# Patient Record
Sex: Female | Born: 1990 | Race: White | Hispanic: No | Marital: Married | Smoking: Never smoker
Health system: Southern US, Community
[De-identification: ages and names within clinical notes are randomized; demographics above are authoritative.]

## PROBLEM LIST (undated history)

## (undated) ENCOUNTER — Inpatient Hospital Stay (HOSPITAL_COMMUNITY): Payer: Self-pay

## (undated) DIAGNOSIS — F419 Anxiety disorder, unspecified: Secondary | ICD-10-CM

## (undated) DIAGNOSIS — Z789 Other specified health status: Secondary | ICD-10-CM

## (undated) HISTORY — PX: CYST EXCISION: SHX5701

## (undated) HISTORY — DX: Anxiety disorder, unspecified: F41.9

---
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
## 2011-08-29 ENCOUNTER — Ambulatory Visit: Payer: Self-pay | Admitting: Obstetrics and Gynecology

## 2011-12-17 ENCOUNTER — Telehealth: Payer: Self-pay | Admitting: Obstetrics and Gynecology

## 2011-12-17 NOTE — Telephone Encounter (Signed)
Tc to pt regarding msg.  Pt states she is getting married in January and wanted to possibly come in to discuss getting on birth control pills, pt states she does not want to get a vaginal exam to get the pills.  Pt advised typically a vaginal exam is done and pt is 21 yoa, so could possibly have a vaginal exam.  Pt declines the appt, advised she may use condoms and come back into the office after she gets married to be put on birth control pills.  Pt voices agreement.

## 2011-12-27 ENCOUNTER — Encounter: Payer: Self-pay | Admitting: Obstetrics and Gynecology

## 2014-11-09 ENCOUNTER — Ambulatory Visit (INDEPENDENT_AMBULATORY_CARE_PROVIDER_SITE_OTHER): Payer: 59 | Admitting: Physician Assistant

## 2014-11-09 ENCOUNTER — Encounter: Payer: Self-pay | Admitting: Physician Assistant

## 2014-11-09 VITALS — BP 106/64 | HR 83 | Ht 63.0 in | Wt 126.0 lb

## 2014-11-09 DIAGNOSIS — Z331 Pregnant state, incidental: Secondary | ICD-10-CM

## 2014-11-09 DIAGNOSIS — F411 Generalized anxiety disorder: Secondary | ICD-10-CM | POA: Diagnosis not present

## 2014-11-09 DIAGNOSIS — Z3490 Encounter for supervision of normal pregnancy, unspecified, unspecified trimester: Secondary | ICD-10-CM

## 2014-11-09 MED ORDER — PRENATAL VITAMIN PLUS LOW IRON 27-1 MG PO TABS: 1.0000 | ORAL_TABLET | Freq: Every day | ORAL | Status: DC

## 2014-11-09 NOTE — Progress Notes (Signed)
   Subjective:    Patient ID: Ariel Andrews, female    DOB: 09/12/1990, 24 y.o.   MRN: [id number]  HPI   Patient is a 24 year old female who presents to the clinic with her mother. She is here to establish care and to confirm pregnancy. She also has some concerns and questions with pregnancy.  Patient has no past medical history. She has no surgical history.Val Riles. Unaware of any significant family history.  History of partake in. She also does not affect. Her last missed. Was 10/05/14 and she has had 5 positive home pregnancy test. She denies any nausea or vomiting. She had some very light bleeding 4 days ago that has since resolved. She does have occasional cramping.   Review of Systems  All other systems reviewed and are negative.      Objective:   Physical Exam  Constitutional: She is oriented to person, place, and time. She appears well-developed and well-nourished.  HENT:  Head: Normocephalic and atraumatic.  Cardiovascular: Normal rate, regular rhythm and normal heart sounds.   Pulmonary/Chest: Effort normal and breath sounds normal.  Neurological: She is alert and oriented to person, place, and time.  Psychiatric: She has a normal mood and affect. Her behavior is normal.          Assessment & Plan:   Pregnancy- will confirm with serum HCG. due date from last missed. Is 08/11/15. Discussed signs of miscarriage. Will send referral for OB care needs appt in next 2-3 at 8 weeks. Prenatal vitamin given. Pt questions about Iodine. Can ask pharmacist how much is in [facility]. 150mcg daily of Iodine is recommended dose. Gave HO with what to expect in 1st trimester and medications to avoid and that are safe.   Anxiety- discussed with patient about anxiety during pregnancy. Encouraged her to partake in relaxation exercises such as the size, essential goals, walking. Patient is mostly concerned that her anxiety will cause a miscarriage. Reassured her that excessive stress is not as healthy for the  fetus is less likely to cause miscarriage. I do not think she is a candidate for medication but that could change as pregnancy progresses.

## 2014-11-09 NOTE — Patient Instructions (Signed)
First Trimester of Pregnancy The first trimester of pregnancy is from week 1 until the end of week 12 (months 1 through 3). A week after a sperm fertilizes an egg, the egg will implant on the wall of the uterus. This embryo will begin to develop into a baby. Genes from you and your partner are forming the baby. The female genes determine whether the baby is a boy or a girl. At 6-8 weeks, the eyes and face are formed, and the heartbeat can be seen on ultrasound. At the end of 12 weeks, all the baby's organs are formed.  Now that you are pregnant, you will want to do everything you can to have a healthy baby. Two of the most important things are to get good prenatal care and to follow your health care provider's instructions. Prenatal care is all the medical care you receive before the baby's birth. This care will help prevent, find, and treat any problems during the pregnancy and childbirth. BODY CHANGES Your body goes through many changes during pregnancy. The changes vary from woman to woman.   You may gain or lose a couple of pounds at first.  You may feel sick to your stomach (nauseous) and throw up (vomit). If the vomiting is uncontrollable, call your health care provider.  You may tire easily.  You may develop headaches that can be relieved by medicines approved by your health care provider.  You may urinate more often. Painful urination may mean you have a bladder infection.  You may develop heartburn as a result of your pregnancy.  You may develop constipation because certain hormones are causing the muscles that push waste through your intestines to slow down.  You may develop hemorrhoids or swollen, bulging veins (varicose veins).  Your breasts may begin to grow larger and become tender. Your nipples may stick out more, and the tissue that surrounds them (areola) may become darker.  Your gums may bleed and may be sensitive to brushing and flossing.  Dark spots or blotches (chloasma,  mask of pregnancy) may develop on your face. This will likely fade after the baby is born.  Your menstrual periods will stop.  You may have a loss of appetite.  You may develop cravings for certain kinds of food.  You may have changes in your emotions from day to day, such as being excited to be pregnant or being concerned that something may go wrong with the pregnancy and baby.  You may have more vivid and strange dreams.  You may have changes in your hair. These can include thickening of your hair, rapid growth, and changes in texture. Some women also have hair loss during or after pregnancy, or hair that feels dry or thin. Your hair will most likely return to normal after your baby is born. WHAT TO EXPECT AT YOUR PRENATAL VISITS During a routine prenatal visit:  You will be weighed to make sure you and the baby are growing normally.  Your blood pressure will be taken.  Your abdomen will be measured to track your baby's growth.  The fetal heartbeat will be listened to starting around week 10 or 12 of your pregnancy.  Test results from any previous visits will be discussed. Your health care provider may ask you:  How you are feeling.  If you are feeling the baby move.  If you have had any abnormal symptoms, such as leaking fluid, bleeding, severe headaches, or abdominal cramping.  If you are using any tobacco products,   including cigarettes, chewing tobacco, and electronic cigarettes.  If you have any questions. Other tests that may be performed during your first trimester include:  Blood tests to find your blood type and to check for the presence of any previous infections. They will also be used to check for low iron levels (anemia) and Rh antibodies. Later in the pregnancy, blood tests for diabetes will be done along with other tests if problems develop.  Urine tests to check for infections, diabetes, or protein in the urine.  An ultrasound to confirm the proper growth  and development of the baby.  An amniocentesis to check for possible genetic problems.  Fetal screens for spina bifida and Down syndrome.  You may need other tests to make sure you and the baby are doing well.  HIV (human immunodeficiency virus) testing. Routine prenatal testing includes screening for HIV, unless you choose not to have this test. HOME CARE INSTRUCTIONS  Medicines  Follow your health care provider's instructions regarding medicine use. Specific medicines may be either safe or unsafe to take during pregnancy.  Take your prenatal vitamins as directed.  If you develop constipation, try taking a stool softener if your health care provider approves. Diet  Eat regular, well-balanced meals. Choose a variety of foods, such as meat or vegetable-based protein, fish, milk and low-fat dairy products, vegetables, fruits, and whole grain breads and cereals. Your health care provider will help you determine the amount of weight gain that is right for you.  Avoid raw meat and uncooked cheese. These carry germs that can cause birth defects in the baby.  Eating four or five small meals rather than three large meals a day may help relieve nausea and vomiting. If you start to feel nauseous, eating a few soda crackers can be helpful. Drinking liquids between meals instead of during meals also seems to help nausea and vomiting.  If you develop constipation, eat more high-fiber foods, such as fresh vegetables or fruit and whole grains. Drink enough fluids to keep your urine clear or pale yellow. Activity and Exercise  Exercise only as directed by your health care provider. Exercising will help you:  Control your weight.  Stay in shape.  Be prepared for labor and delivery.  Experiencing pain or cramping in the lower abdomen or low back is a good sign that you should stop exercising. Check with your health care provider before continuing normal exercises.  Try to avoid standing for long  periods of time. Move your legs often if you must stand in one place for a long time.  Avoid heavy lifting.  Wear low-heeled shoes, and practice good posture.  You may continue to have sex unless your health care provider directs you otherwise. Relief of Pain or Discomfort  Wear a good support bra for breast tenderness.   Take warm sitz baths to soothe any pain or discomfort caused by hemorrhoids. Use hemorrhoid cream if your health care provider approves.   Rest with your legs elevated if you have leg cramps or low back pain.  If you develop varicose veins in your legs, wear support hose. Elevate your feet for 15 minutes, 3-4 times a day. Limit salt in your diet. Prenatal Care  Schedule your prenatal visits by the twelfth week of pregnancy. They are usually scheduled monthly at first, then more often in the last 2 months before delivery.  Write down your questions. Take them to your prenatal visits.  Keep all your prenatal visits as directed by your   health care provider. Safety  Wear your seat belt at all times when driving.  Make a list of emergency phone numbers, including numbers for family, friends, the hospital, and police and fire departments. General Tips  Ask your health care provider for a referral to a local prenatal education class. Begin classes no later than at the beginning of month 6 of your pregnancy.  Ask for help if you have counseling or nutritional needs during pregnancy. Your health care provider can offer advice or refer you to specialists for help with various needs.  Do not use hot tubs, steam rooms, or saunas.  Do not douche or use tampons or scented sanitary pads.  Do not cross your legs for long periods of time.  Avoid cat litter boxes and soil used by cats. These carry germs that can cause birth defects in the baby and possibly loss of the fetus by miscarriage or stillbirth.  Avoid all smoking, herbs, alcohol, and medicines not prescribed by  your health care provider. Chemicals in these affect the formation and growth of the baby.  Do not use any tobacco products, including cigarettes, chewing tobacco, and electronic cigarettes. If you need help quitting, ask your health care provider. You may receive counseling support and other resources to help you quit.  Schedule a dentist appointment. At home, brush your teeth with a soft toothbrush and be gentle when you floss. SEEK MEDICAL CARE IF:   You have dizziness.  You have mild pelvic cramps, pelvic pressure, or nagging pain in the abdominal area.  You have persistent nausea, vomiting, or diarrhea.  You have a bad smelling vaginal discharge.  You have pain with urination.  You notice increased swelling in your face, hands, legs, or ankles. SEEK IMMEDIATE MEDICAL CARE IF:   You have a fever.  You are leaking fluid from your vagina.  You have spotting or bleeding from your vagina.  You have severe abdominal cramping or pain.  You have rapid weight gain or loss.  You vomit blood or material that looks like coffee grounds.  You are exposed to German measles and have never had them.  You are exposed to fifth disease or chickenpox.  You develop a severe headache.  You have shortness of breath.  You have any kind of trauma, such as from a fall or a car accident.   This information is not intended to replace advice given to you by your health care provider. Make sure you discuss any questions you have with your health care provider.   Document Released: 12/25/2000 Document Revised: 01/21/2014 Document Reviewed: 11/10/2012 Elsevier Interactive Patient Education 2016 [employer]  

## 2014-12-13 ENCOUNTER — Encounter: Payer: Self-pay | Admitting: Obstetrics & Gynecology

## 2014-12-13 ENCOUNTER — Ambulatory Visit (INDEPENDENT_AMBULATORY_CARE_PROVIDER_SITE_OTHER): Payer: 59 | Admitting: Obstetrics & Gynecology

## 2014-12-13 VITALS — BP 121/79 | HR 86 | Wt 128.0 lb

## 2014-12-13 DIAGNOSIS — Z3481 Encounter for supervision of other normal pregnancy, first trimester: Secondary | ICD-10-CM

## 2014-12-13 DIAGNOSIS — Z3490 Encounter for supervision of normal pregnancy, unspecified, unspecified trimester: Secondary | ICD-10-CM | POA: Insufficient documentation

## 2014-12-13 DIAGNOSIS — Z3491 Encounter for supervision of normal pregnancy, unspecified, first trimester: Secondary | ICD-10-CM

## 2014-12-13 MED ORDER — CETIRIZINE-PSEUDOEPHEDRINE ER 5-120 MG PO TB12: 1.0000 | ORAL_TABLET | Freq: Two times a day (BID) | ORAL | Status: DC

## 2014-12-14 ENCOUNTER — Encounter: Payer: Self-pay | Admitting: Obstetrics & Gynecology

## 2014-12-14 LAB — OBSTETRIC PANEL
ANTIBODY SCREEN: NEGATIVE
BASOS ABS: 0 10*3/uL (ref 0.0–0.1)
BASOS PCT: 0 % (ref 0–1)
EOS PCT: 3 % (ref 0–5)
Eosinophils Absolute: 0.4 10*3/uL (ref 0.0–0.7)
HEMATOCRIT: 39.6 % (ref 36.0–46.0)
HEMOGLOBIN: 13.7 g/dL (ref 12.0–15.0)
Hepatitis B Surface Ag: NEGATIVE
LYMPHS PCT: 17 % (ref 12–46)
Lymphs Abs: 2.5 10*3/uL (ref 0.7–4.0)
MCH: 29.8 pg (ref 26.0–34.0)
MCHC: 34.6 g/dL (ref 30.0–36.0)
MCV: 86.1 fL (ref 78.0–100.0)
MPV: 11.3 fL (ref 8.6–12.4)
Monocytes Absolute: 0.7 10*3/uL (ref 0.1–1.0)
Monocytes Relative: 5 % (ref 3–12)
Neutro Abs: 10.9 10*3/uL — ABNORMAL HIGH (ref 1.7–7.7)
Neutrophils Relative %: 75 % (ref 43–77)
PLATELETS: 236 10*3/uL (ref 150–400)
RBC: 4.6 MIL/uL (ref 3.87–5.11)
RDW: 13.8 % (ref 11.5–15.5)
RH TYPE: POSITIVE
Rubella: 2.95 Index — ABNORMAL HIGH (ref <0.90)
WBC: 14.5 10*3/uL — AB (ref 4.0–10.5)

## 2014-12-14 LAB — GC/CHLAMYDIA PROBE AMP
CT PROBE, AMP APTIMA: NEGATIVE
GC PROBE AMP APTIMA: NEGATIVE

## 2014-12-14 LAB — HIV ANTIBODY (ROUTINE TESTING W REFLEX): HIV 1&2 Ab, 4th Generation: NONREACTIVE

## 2014-12-14 NOTE — Progress Notes (Signed)
   Subjective:    Ariel ManifoldJessica Spenser is a G1P0 2573w3d being seen today for her first obstetrical visit.  Her obstetrical history is significant for primpara, uncomfortable with pelvic examinations due to unpleasant experience as a  teen.. Patient does intend to breast feed. Pregnancy history fully reviewed.  Patient reports nausea, vomit x1, nasal congestion c/w seasonal allergies.  Filed Vitals:   12/13/14 1514  BP: 121/79  Pulse: 86  Weight: 128 lb (58.06 kg)    HISTORY: OB History  Gravida Para Term Preterm AB SAB TAB Ectopic Multiple Living  1             # Outcome Date GA Lbr Len/2nd Weight Sex Delivery Anes PTL Lv  1 Current              History reviewed. No pertinent past medical history. Past Surgical History  Procedure Laterality Date  . Cyst excision     History reviewed. No pertinent family history.   Exam    Uterus:     Pelvic Exam:    Perineum: No Hemorrhoids   Vulva: normal   Vagina:  Unable to tolerate exam   pH: n/a   Cervix: Unable to tolerate exam   Adnexa: Unable to tolerate exam   Bony Pelvis: Unable to tolerate exam  System: Breast:  normal appearance, no masses or tenderness   Skin: normal coloration and turgor, no rashes    Neurologic: oriented, normal mood   Extremities: normal strength, tone, and muscle mass   HEENT sclera clear, anicteric   Mouth/Teeth mucous membranes moist, pharynx normal without lesions and dental hygiene good   Neck supple and no masses   Cardiovascular: regular rate and rhythm   Respiratory:  appears well, vitals normal, no respiratory distress, acyanotic, normal RR, chest clear, no wheezing, crepitations, rhonchi, normal symmetric air entry   Abdomen: soft, non-tender; bowel sounds normal; no masses,  no organomegaly   Urinary: Unable to tolerate exam      Assessment:    Pregnancy: G1P0 Patient Active Problem List   Diagnosis Date Noted  . Supervision of normal pregnancy, antepartum 12/13/2014  . Anxiety state  11/09/2014        Plan:     Initial labs drawn. Prenatal vitamins. Problem list reviewed and updated. Genetic Screening discussed First Screen: discussed.  She is unsure.  Will discuss again at 12 week vivist or she may call in and requet the test..  Ultrasound discussed; fetal survey: requested.  Follow up in 3 weeks.  Patient unable to tolerate any pelvic exam  We will try again at 12 weeks. Will discuss her history of unpleasant pelvic exam in the past.  Husband was present during this visit.  LEGGETT,KELLY H. 12/14/2014

## 2014-12-15 LAB — CYSTIC FIBROSIS DIAGNOSTIC STUDY

## 2014-12-16 LAB — CULTURE, URINE COMPREHENSIVE

## 2014-12-19 NOTE — Progress Notes (Signed)
Bedside abdominal US shows single IUP 6045w2d CRL 2.65cm FHR 162

## 2015-01-03 ENCOUNTER — Telehealth: Payer: Self-pay | Admitting: *Deleted

## 2015-01-03 ENCOUNTER — Other Ambulatory Visit: Payer: 59 | Admitting: Obstetrics & Gynecology

## 2015-01-03 NOTE — Telephone Encounter (Signed)
Pt called stating that she was afraid that she has been exposed to lead today.  She was sanding a table but was wearing a mask and working outside but found out that the table had lead paint on it.  Spoke with Dr Macon LargeAnyanwu who stated that she felt that it was highly unlikely that she would test positive for the Lead.  Pt is really wanting to come in and have peace of mind.

## 2015-01-05 ENCOUNTER — Ambulatory Visit (HOSPITAL_COMMUNITY): Payer: 59 | Attending: Obstetrics & Gynecology

## 2015-01-05 ENCOUNTER — Other Ambulatory Visit (HOSPITAL_COMMUNITY): Payer: 59

## 2015-01-05 LAB — LEAD, BLOOD (ADULT >= 16 YRS)

## 2015-01-06 ENCOUNTER — Telehealth: Payer: Self-pay | Admitting: *Deleted

## 2015-01-06 ENCOUNTER — Ambulatory Visit (INDEPENDENT_AMBULATORY_CARE_PROVIDER_SITE_OTHER): Payer: 59 | Admitting: Family

## 2015-01-06 VITALS — BP 106/70 | HR 86 | Wt 130.0 lb

## 2015-01-06 DIAGNOSIS — Z3481 Encounter for supervision of other normal pregnancy, first trimester: Secondary | ICD-10-CM

## 2015-01-06 DIAGNOSIS — Z36 Encounter for antenatal screening of mother: Secondary | ICD-10-CM

## 2015-01-06 DIAGNOSIS — Z3401 Encounter for supervision of normal first pregnancy, first trimester: Secondary | ICD-10-CM

## 2015-01-06 NOTE — Progress Notes (Signed)
Patient complaining of still having nausea and vomiting, states she will try motion sickness bands before any medication. Armandina StammerJennifer Malisha Mabey RN BSN

## 2015-01-06 NOTE — Progress Notes (Signed)
Subjective:  Ariel ManifoldJessica Andrews is a 24 y.o. G1P0 at [redacted]w[redacted]d being seen today for ongoing prenatal care.  She is currently monitored for the following issues for this low-risk pregnancy and has anxiety state and supervision of normal pregnancy, antepartum on her problem list.  Patient reports nausea.  Overall nausea has improved over the past week.  Increased this morning.   Contractions: Not present. Vag. Bleeding: None.  Movement: Absent. Denies leaking of fluid.   The following portions of the patient's history were reviewed and updated as appropriate: allergies, current medications, past family history, past medical history, past social history, past surgical history and problem list. Problem list updated.  Objective:   Filed Vitals:   01/06/15 1044  BP: 106/70  Pulse: 86  Weight: 130 lb (58.968 kg)    Fetal Status: Fetal Heart Rate (bpm): 150 Fundal Height: 13 cm Movement: Absent     General:  Alert, oriented and cooperative. Patient is in no acute distress.  Skin: Skin is warm and dry. No rash noted.   Cardiovascular: Normal heart rate noted  Respiratory: Normal respiratory effort, no problems with respiration noted  Abdomen: Soft, gravid, appropriate for gestational age. Pain/Pressure: Absent     Pelvic: Vag. Bleeding: None Vag D/C Character: Thin   Cervical exam deferred        Extremities: Normal range of motion.  Edema: None  Mental Status: Normal mood and affect. Normal behavior. Normal judgment and thought content.   Urinalysis: Urine Protein: Negative Urine Glucose: Negative  Assessment and Plan:  Pregnancy: G1P0 at 291w5d  1. Prenatal care, first pregnancy in first trimester - CULTURE, URINE COMPREHENSIVE - Reviewed new ob lab results - Discussed Baby Scripts program   General obstetric precautions including but not limited to vaginal bleeding and pelvic pain reviewed in detail with the patient. Return in about 8 weeks (around 03/03/2015).   Eino FarberWalidah Kennith GainN Karim, CNM

## 2015-01-06 NOTE — Telephone Encounter (Signed)
Called pt to adv blood lead was normal. Pt expressed understanding.

## 2015-01-07 LAB — CULTURE, URINE COMPREHENSIVE
Colony Count: NO GROWTH
Organism ID, Bacteria: NO GROWTH

## 2015-01-15 NOTE — L&D Delivery Note (Signed)
Delivery Note At 3:26 AM a viable female was delivered via Vaginal, Spontaneous Delivery (Presentation: Right Occiput Anterior).  APGAR: 9, 9; weight 8 lb 9.6 oz (3900 g).  Infant dried and placed on pt's abd. Cord clamped and cut by family member. Hospital cord blood sample collected. Placenta status: Intact, Spontaneous.  Cord: 3 vessels   Anesthesia: Epidural  Episiotomy: None Lacerations: R labial; vaginal Suture Repair: 3.0 vicryl Est. Blood Loss (mL): 668  Bimanual exam after repair due to continued trickle; mod amt clot evacuated. Cytotec 800mcg PO given. Methergine series q 4 hr ordered, and CBC for PPD#1.  Mom to postpartum.  Baby to Couplet care / Skin to Skin.   Cam HaiSHAW, Itzae Mccurdy CNM 07/06/2015, 4:02 AM

## 2015-02-09 ENCOUNTER — Telehealth: Payer: Self-pay | Admitting: *Deleted

## 2015-02-09 ENCOUNTER — Ambulatory Visit (INDEPENDENT_AMBULATORY_CARE_PROVIDER_SITE_OTHER): Payer: 59 | Admitting: Obstetrics & Gynecology

## 2015-02-09 VITALS — BP 120/75 | HR 82 | Wt 136.0 lb

## 2015-02-09 DIAGNOSIS — F525 Vaginismus not due to a substance or known physiological condition: Secondary | ICD-10-CM | POA: Insufficient documentation

## 2015-02-09 DIAGNOSIS — Z3402 Encounter for supervision of normal first pregnancy, second trimester: Secondary | ICD-10-CM

## 2015-02-09 DIAGNOSIS — K5900 Constipation, unspecified: Secondary | ICD-10-CM

## 2015-02-09 MED ORDER — DOCUSATE SODIUM 100 MG PO CAPS: 100.0000 mg | ORAL_CAPSULE | Freq: Two times a day (BID) | ORAL | Status: DC | PRN

## 2015-02-09 NOTE — Telephone Encounter (Signed)
Pt called in stating she has had lower ab/pelvic pain that comes and goes. She denies bleeding. Unsure if its contractions or what it could be. Pt states she has a doppler at home and was able to hear heartbeat. Advised pt to stay well hydrated and we could bring her in for eval to be sure all is well. Pt made appt for today.

## 2015-02-09 NOTE — Progress Notes (Signed)
Subjective:  Ariel Andrews is a 25 y.o. G1P0 at [redacted]w[redacted]d being seen today for ongoing prenatal care.  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  Patient reports several episodes of sharp pain.  Pain is associated with gas and constipation.  No vaginal discharge, vaginal pressure, vaginal bleeding..  Contractions: Not present. Vag. Bleeding: None.  Movement: Present. Denies leaking of fluid.   The following portions of the patient's history were reviewed and updated as appropriate: allergies, current medications, past family history, past medical history, past social history, past surgical history and problem list. Problem list updated.  Objective:   Filed Vitals:   02/09/15 1551  BP: 120/75  Pulse: 82  Weight: 136 lb (61.689 kg)    Fetal Status: Fetal Heart Rate (bpm): 150   Movement: Present     General:  Alert, oriented and cooperative. Patient is in no acute distress.  Skin: Skin is warm and dry. No rash noted.   Cardiovascular: Normal heart rate noted  Respiratory: Normal respiratory effort, no problems with respiration noted  Abdomen: Soft, gravid, appropriate for gestational age. Pain/Pressure: Present     Pelvic: Vag. Bleeding: None Vag D/C Character: Thin   Cervical exam performed      Pt unable to tolerate finger in the vagina further than 1-2 cm.  Extremities: Normal range of motion.  Edema: None  Mental Status: Normal mood and affect. Normal behavior. Normal judgment and thought content.   Urinalysis: Urine Protein: Negative Urine Glucose: Negative  Assessment and Plan:  Pregnancy: G1P0 at [redacted]w[redacted]d  1. Psychogenic vaginismus -vaginal dilators  2.  Constipation -increase water -colace  3.  Anatomy US next week -transabdominal view of cervix  Preterm labor symptoms and general obstetric precautions including but not  limited to vaginal bleeding, contractions, leaking of fluid and fetal movement were reviewed in detail with the patient. Please refer to After Visit Summary for other counseling recommendations.  No Follow-up on file.   Lesly Dukes, MD

## 2015-02-09 NOTE — Progress Notes (Signed)
Pt spoke with CMA today and c/o abd pain.  She was unsure of whether it was round ligament pain vs contractions.  Pt stated that she just wanted to have it checked out.  She is babyscripts and is compliant

## 2015-02-13 ENCOUNTER — Other Ambulatory Visit: Payer: Self-pay | Admitting: Family

## 2015-02-13 ENCOUNTER — Ambulatory Visit (HOSPITAL_COMMUNITY)
Admission: RE | Admit: 2015-02-13 | Discharge: 2015-02-13 | Disposition: A | Discharge status: Home / Self Care | Payer: 59 | Source: Ambulatory Visit | Attending: Family | Admitting: Family

## 2015-02-13 DIAGNOSIS — Z3481 Encounter for supervision of other normal pregnancy, first trimester: Secondary | ICD-10-CM

## 2015-02-13 DIAGNOSIS — Z3A18 18 weeks gestation of pregnancy: Secondary | ICD-10-CM | POA: Insufficient documentation

## 2015-02-13 DIAGNOSIS — O358XX0 Maternal care for other (suspected) fetal abnormality and damage, not applicable or unspecified: Secondary | ICD-10-CM | POA: Insufficient documentation

## 2015-02-13 DIAGNOSIS — Z36 Encounter for antenatal screening of mother: Secondary | ICD-10-CM | POA: Diagnosis not present

## 2015-02-13 DIAGNOSIS — O283 Abnormal ultrasonic finding on antenatal screening of mother: Secondary | ICD-10-CM

## 2015-02-15 ENCOUNTER — Other Ambulatory Visit: Payer: Self-pay | Admitting: Family

## 2015-02-15 ENCOUNTER — Telehealth: Payer: Self-pay

## 2015-02-15 ENCOUNTER — Telehealth: Payer: Self-pay | Admitting: Family

## 2015-02-15 DIAGNOSIS — O283 Abnormal ultrasonic finding on antenatal screening of mother: Secondary | ICD-10-CM | POA: Insufficient documentation

## 2015-02-15 NOTE — Telephone Encounter (Addendum)
Left message for patient to call office to get schedule for genetic counseling.

## 2015-02-15 NOTE — Telephone Encounter (Signed)
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  Pt will coordinate the appointment with staff at [facility] office.

## 2015-02-16 ENCOUNTER — Ambulatory Visit (HOSPITAL_COMMUNITY)
Admission: RE | Admit: 2015-02-16 | Discharge: 2015-02-16 | Disposition: A | Discharge status: Home / Self Care | Payer: 59 | Source: Ambulatory Visit | Attending: Obstetrics & Gynecology | Admitting: Obstetrics & Gynecology

## 2015-02-16 DIAGNOSIS — O283 Abnormal ultrasonic finding on antenatal screening of mother: Secondary | ICD-10-CM

## 2015-02-16 DIAGNOSIS — IMO0002 Reserved for concepts with insufficient information to code with codable children: Secondary | ICD-10-CM | POA: Insufficient documentation

## 2015-02-16 DIAGNOSIS — IMO0001 Reserved for inherently not codable concepts without codable children: Secondary | ICD-10-CM

## 2015-02-16 NOTE — Progress Notes (Signed)
Genetic Counseling  High-Risk Gestation Note  Appointment Date:  02/16/2015 Referred By: Lesly Dukes, MD Date of Birth:  1990/10/10 Partner:  Lynetta Mare   Pregnancy History: G1P0 Estimated Date of Delivery: 07/16/15 Estimated Gestational Age: [redacted]w[redacted]d Attending: Alpha Gula, MD   Mrs. Jonessa Triplett and her husband, Mr. Aerica Rincon, were seen for genetic counseling because of soft markers on previous ultrasound.  In Summary:   Choroid plexus cysts and echogenic intracardiac focus identified on anatomy ultrasound on 02/13/15  Given patient's age alone, considered low risk for fetal aneuploidy  Reviewed CPC as soft marker for Trisomy 74; As an isolated finding, would not significantly alter patient's age-related risk for T18  Reviewed EIF as soft marker for Down syndrome;  If adjusted patient's age related risk, would still be less than risk for complications for amniocentesis  Options of Quad screen, NIPS, and amniocentesis discussed; patient declined all at this time  They may further consider NIPS and plan to first call their insurance to check on coverage  Follow-up ultrasound scheduled for 03/16/15  Mrs. Marvis Bakken was seen at the 02/14/2015 for ultrasound through [facility] Radiology Department.  Ultrasound revealed an echogenic intracardiac focus (EIF) and choroid plexus cysts.  The ultrasound report is under separate cover.  Fetal anatomic survey was not able to be completed on 02/14/15; thus, a follow-up ultrasound is scheduled for 03/16/15 to complete fetal anatomic survey.   We discussed that the second trimester genetic sonogram is targeted at identifying features associated with aneuploidy.  It has evolved as a screening tool used to provide an individualized risk assessment for Down syndrome and other trisomies.  The ability of sonography to aid in the detection of aneuploidies relies on identification of both major structural anomalies and "soft markers."  The  patient was counseled that the latter term refers to findings that are often normal variants and do not cause any significant medical problems.  Nonetheless, these markers have a known association with aneuploidy.    The patient was counseled that an EIF is characterized by calcified papillary muscle leading to a discreet dot in the left, or less commonly, the right ventricle.  We discussed that this finding is typically considered to be a benign variant; however, the risk of aneuploidy is increased when this marker is found in patients who have additional risk factors for fetal aneuploidy (AMA, abnormal screening test, or other markers or anomalies by fetal ultrasound). We reviewed that Mrs. Margarine Grosshans risk to have a fetus with Down syndrome is ~1 in 1147, based on her age and current gestation.  We discussed that the pregnancy would still be considered at low risk for fetal Down syndrome given her age related risk and the ultrasound finding of amniocentesis. If the finding of EIF is used to adjust her age related risk, the chance for fetal Down syndrome is still estimated to be below the risk of amniocentesis.   They were counseled that the choroid plexus is an area in the brain where cerebral spinal fluid, the fluid that bathes the brain and spinal cord, is made.  Cysts, or fluid filled sacs, are sometimes found in the choroid plexus of babies both before and after they are born.  We discussed that approximately 1% of pregnancies evaluated by ultrasound will show choroid plexus cyst (CPCs).  Literature suggests that CPCs are an ultrasound finding in approximately 30-50% of fetuses with trisomy 18, but are an isolated finding in less than 10% of fetuses with trisomy  18.  Mrs. Zully Frane was counseled that when a patient has other risk factors for fetal trisomy 27 (abnormal First trimester or quad screening, advanced maternal age, or another ultrasound finding), CPCs are associated with an increased  risk (LR of 9) for trisomy 64.  Newer literature suggests that in the absence of other risk factors, CPCs are likely a normal variation of development or a benign finding.  CPCs are not associated with an increased risk for fetal Down syndrome. Thus, the finding of CPC would not be expected to significantly alter her age-related risk for trisomy 18 of approximately 1 in 45.   We reviewed chromosomes, nondisjunction, and the common features and prognoses of Down syndrome (trisomy 76) and trisomy 62.  We discussed screening options for fetal aneuploidy including Quad screen and noninvasive prenatal screening (NIPS)/cell free DNA testing. We discussed the benefits and limitations of these including the methodologies of the screen, conditions for which each assesses, detection rates and false positive rates of each.   In addition, we discussed the option of diagnostic testing via amniocentesis.  We reviewed the approximate 1 in 300-500 risk for complications, including spontaneous pregnancy loss. After consideration of all the options, they declined amniocentesis, given the associated risk for complications. Mrs. Lafon also declined screening options today, including Quad screen and NIPS. She plans to further consider NIPS and may proceed with this at a later date.   Both family histories were reviewed and found to be contributory for possibly cystic fibrosis in the patient's paternal distant cousin. She did not report the exact degree of relation and was unsure if this was the exact diagnosis for the relative. Mrs. Antionette Luster was provided with written information regarding cystic fibrosis (CF) including the carrier frequency and incidence in the Caucasian population, the availability of carrier testing and prenatal diagnosis if indicated.  In addition, we discussed that CF is routinely screened for as part of the [place] newborn screening panel. Her risk to be a CF carrier may possibly be increased by this family  history and would depend upon the degree of relation.  She declined CF testing today. Without further information regarding the provided family history, an accurate genetic risk cannot be calculated. Further genetic counseling is warranted if more information is obtained.  Mrs. Eriyonna Matsushita denied exposure to environmental toxins or chemical agents. She denied the use of alcohol, tobacco or street drugs. She denied significant viral illnesses during the course of her pregnancy. Her medical and surgical histories were noncontributory.   I counseled this couple regarding the above risks and available options.  The approximate face-to-face time with the genetic counselor was 45 minutes.     Quinn Plowman, MS Certified Genetic Counselor 02/16/2015

## 2015-02-23 ENCOUNTER — Encounter (HOSPITAL_COMMUNITY): Payer: 59

## 2015-03-03 ENCOUNTER — Encounter: Payer: 59 | Admitting: Advanced Practice Midwife

## 2015-03-10 ENCOUNTER — Encounter (HOSPITAL_COMMUNITY): Payer: Self-pay | Admitting: *Deleted

## 2015-03-10 ENCOUNTER — Inpatient Hospital Stay (HOSPITAL_COMMUNITY)
Admission: AD | Admit: 2015-03-10 | Discharge: 2015-03-10 | Disposition: A | Discharge status: Home / Self Care | Payer: 59 | Source: Ambulatory Visit | Attending: Family Medicine | Admitting: Family Medicine

## 2015-03-10 DIAGNOSIS — O9989 Other specified diseases and conditions complicating pregnancy, childbirth and the puerperium: Secondary | ICD-10-CM | POA: Diagnosis not present

## 2015-03-10 DIAGNOSIS — O26892 Other specified pregnancy related conditions, second trimester: Secondary | ICD-10-CM | POA: Insufficient documentation

## 2015-03-10 DIAGNOSIS — H9313 Tinnitus, bilateral: Secondary | ICD-10-CM | POA: Insufficient documentation

## 2015-03-10 DIAGNOSIS — Z3A25 25 weeks gestation of pregnancy: Secondary | ICD-10-CM | POA: Diagnosis not present

## 2015-03-10 DIAGNOSIS — R42 Dizziness and giddiness: Secondary | ICD-10-CM | POA: Diagnosis present

## 2015-03-10 DIAGNOSIS — E162 Hypoglycemia, unspecified: Secondary | ICD-10-CM

## 2015-03-10 LAB — GLUCOSE, CAPILLARY: GLUCOSE-CAPILLARY: 109 mg/dL — AB (ref 65–99)

## 2015-03-10 LAB — URINE MICROSCOPIC-ADD ON

## 2015-03-10 LAB — URINALYSIS, ROUTINE W REFLEX MICROSCOPIC
BILIRUBIN URINE: NEGATIVE
Glucose, UA: NEGATIVE mg/dL
Ketones, ur: NEGATIVE mg/dL
Leukocytes, UA: NEGATIVE
Nitrite: NEGATIVE
PH: 6 (ref 5.0–8.0)
Protein, ur: NEGATIVE mg/dL

## 2015-03-10 NOTE — Discharge Instructions (Signed)

## 2015-03-10 NOTE — MAU Note (Signed)
PT  SAYS SHE   ATE   BKFT-    THEN PIZZA  AT 12 NOON ,   THEN   CANDY BAR AT  3PM,    THEN WENT  TO EAT  AT 8PM-  FELT   SHAKY , DIZZY, EARS RINGING,  VISION BLACK-      THEN SHE   BEGAN TO FEEL BETTER-  SO  SHE   DRANK  TEA    AND ATE  AND  THEN  SHE  FELT  EVEN  BETTER.      [facility]   [facility]   ON    [facility].         NEXT APPOINTMENT - MARCH  6   HAS AN APPOINTMENT  HERE ON Thursday  FOR  U/S-  AT MFM     ROUTINE-

## 2015-03-10 NOTE — MAU Provider Note (Signed)
  History     CSN: [id number]  Arrival date and time: 03/10/15 2143   None     No chief complaint on file.  HPI Ariel Andrews is a 25yo G1 presenting for eval of feeling shaky and dizzy w/ ringing in ears just prior to eating dinner. Before that she had eaten a candy bar at 3pm. After drinking sweet tea and eating dinner she felt better, but was concerned re blood pressure. She receives [facility] at the [place] office and the pregnancy has been essentially unremarkable.  OB History    Gravida Para Term Preterm AB TAB SAB Ectopic Multiple Living   1               No past medical history on file.  Past Surgical History  Procedure Laterality Date  . Cyst excision      No family history on file.  Social History  Substance Use Topics  . Smoking status: Never Smoker   . Smokeless tobacco: Not on file  . Alcohol Use: No    Allergies: No Known Allergies  Prescriptions prior to admission  Medication Sig Dispense Refill Last Dose  . docusate sodium (COLACE) 100 MG capsule Take 1 capsule (100 mg total) by mouth 2 (two) times daily as needed. 30 capsule 2   . Prenatal Vit-Fe Fumarate-FA (PRENATAL VITAMIN PLUS LOW IRON) 27-1 MG TABS Take 1 tablet by mouth daily. 30 tablet 11 Taking    ROS Physical Exam   Last menstrual period 10/05/2014.  Physical Exam  Constitutional: She is oriented to person, place, and time. She appears well-developed.  HENT:  Head: Normocephalic.  Neck: Normal range of motion.  GI:  FHTs 146bpm  Musculoskeletal: Normal range of motion.  Neurological: She is alert and oriented to person, place, and time.  Skin: Skin is warm and dry.  Psychiatric: She has a normal mood and affect. Her behavior is normal. Thought content normal.   Urinalysis    Component Value Date/Time   COLORURINE YELLOW 03/10/2015 2145   APPEARANCEUR CLEAR 03/10/2015 2145   LABSPEC <1.005* 03/10/2015 2145   PHURINE 6.0 03/10/2015 2145   GLUCOSEU NEGATIVE 03/10/2015 2145   HGBUR  TRACE* 03/10/2015 2145   BILIRUBINUR NEGATIVE 03/10/2015 2145   KETONESUR NEGATIVE 03/10/2015 2145   PROTEINUR NEGATIVE 03/10/2015 2145   NITRITE NEGATIVE 03/10/2015 2145   LEUKOCYTESUR NEGATIVE 03/10/2015 2145   Micro: 0-5 SE, rare bacteria  CBG: 109   MAU Course  Procedures  MDM UA & CBG ordered   Assessment and Plan  IUP@21 .5wks Probable hypoglycemic episode  D/C home Instructed to increase total protein 60-70gm/day and eat it throughout the day at meals and snacks F/U at next scheduled visit  Cam Hai CNM 03/10/2015, 10:04 PM

## 2015-03-16 ENCOUNTER — Encounter: Payer: 59 | Admitting: Obstetrics & Gynecology

## 2015-03-16 ENCOUNTER — Ambulatory Visit (HOSPITAL_COMMUNITY)
Admission: RE | Admit: 2015-03-16 | Discharge: 2015-03-16 | Disposition: A | Discharge status: Home / Self Care | Payer: 59 | Source: Ambulatory Visit | Attending: Obstetrics & Gynecology | Admitting: Obstetrics & Gynecology

## 2015-03-16 ENCOUNTER — Other Ambulatory Visit (HOSPITAL_COMMUNITY): Payer: Self-pay | Admitting: Maternal and Fetal Medicine

## 2015-03-16 ENCOUNTER — Ambulatory Visit (HOSPITAL_COMMUNITY): Payer: 59

## 2015-03-16 VITALS — BP 126/70 | HR 88 | Wt 147.6 lb

## 2015-03-16 DIAGNOSIS — O359XX0 Maternal care for (suspected) fetal abnormality and damage, unspecified, not applicable or unspecified: Secondary | ICD-10-CM

## 2015-03-16 DIAGNOSIS — Z36 Encounter for antenatal screening of mother: Secondary | ICD-10-CM | POA: Insufficient documentation

## 2015-03-16 DIAGNOSIS — Z3A22 22 weeks gestation of pregnancy: Secondary | ICD-10-CM | POA: Diagnosis not present

## 2015-03-16 DIAGNOSIS — Z0489 Encounter for examination and observation for other specified reasons: Secondary | ICD-10-CM

## 2015-03-16 DIAGNOSIS — O283 Abnormal ultrasonic finding on antenatal screening of mother: Secondary | ICD-10-CM

## 2015-03-16 DIAGNOSIS — O358XX0 Maternal care for other (suspected) fetal abnormality and damage, not applicable or unspecified: Secondary | ICD-10-CM | POA: Insufficient documentation

## 2015-03-16 DIAGNOSIS — Z3481 Encounter for supervision of other normal pregnancy, first trimester: Secondary | ICD-10-CM

## 2015-03-16 DIAGNOSIS — IMO0002 Reserved for concepts with insufficient information to code with codable children: Secondary | ICD-10-CM

## 2015-03-16 DIAGNOSIS — IMO0001 Reserved for inherently not codable concepts without codable children: Secondary | ICD-10-CM

## 2015-03-17 ENCOUNTER — Other Ambulatory Visit (HOSPITAL_COMMUNITY): Payer: Self-pay | Admitting: *Deleted

## 2015-03-17 DIAGNOSIS — O35EXX0 Maternal care for other (suspected) fetal abnormality and damage, fetal genitourinary anomalies, not applicable or unspecified: Secondary | ICD-10-CM

## 2015-03-17 DIAGNOSIS — O358XX0 Maternal care for other (suspected) fetal abnormality and damage, not applicable or unspecified: Secondary | ICD-10-CM

## 2015-03-20 ENCOUNTER — Ambulatory Visit (INDEPENDENT_AMBULATORY_CARE_PROVIDER_SITE_OTHER): Payer: 59 | Admitting: Obstetrics & Gynecology

## 2015-03-20 VITALS — BP 119/73 | HR 89 | Wt 147.0 lb

## 2015-03-20 DIAGNOSIS — Z3482 Encounter for supervision of other normal pregnancy, second trimester: Secondary | ICD-10-CM

## 2015-03-20 NOTE — Progress Notes (Signed)
Subjective:  Ariel ManifoldJessica Andrews is a 25 y.o. G1P0 at 6782w1d being seen today for ongoing prenatal care.  She is currently monitored for the following issues for this low-risk pregnancy and has Anxiety state; Supervision of normal pregnancy, antepartum; Psychogenic vaginismus; Abnormal fetal ultrasound; Choroid plexus cyst of fetus; and Echogenic intracardiac focus of fetus on prenatal ultrasound on her problem list.  Patient reports no complaints.  Contractions: Not present. Vag. Bleeding: None.  Movement: Absent. Denies leaking of fluid.   The following portions of the patient's history were reviewed and updated as appropriate: allergies, current medications, past family history, past medical history, past social history, past surgical history and problem list. Problem list updated.  Objective:   Filed Vitals:   03/20/15 1538  BP: 119/73  Pulse: 89  Weight: 147 lb (66.679 kg)    Fetal Status: Fetal Heart Rate (bpm): 141  Fundal Height: 24 cm Movement: Absent     General:  Alert, oriented and cooperative. Patient is in no acute distress.  Skin: Skin is warm and dry. No rash noted.   Cardiovascular: Normal heart rate noted  Respiratory: Normal respiratory effort, no problems with respiration noted  Abdomen: Soft, gravid, appropriate for gestational age. Pain/Pressure: Present     Pelvic: Vag. Bleeding: None Vag D/C Character: Thin   Cervical exam deferred        Extremities: Normal range of motion.  Edema: None  Mental Status: Normal mood and affect. Normal behavior. Normal judgment and thought content.   Urinalysis: Urine Protein: Negative Urine Glucose: Negative  Assessment and Plan:  Pregnancy: G1P0 at 3782w1d  1. Supervision of normal pregnancy, antepartum, second trimester -She declines a flu vaccine  Preterm labor symptoms and general obstetric precautions including but not limited to vaginal bleeding, contractions, leaking of fluid and fetal movement were reviewed in detail with  the patient. Please refer to After Visit Summary for other counseling recommendations.  Return in about 4 weeks (around 04/17/2015) for glucola.   Allie BossierMyra C Jolette Lana, MD

## 2015-04-17 ENCOUNTER — Encounter: Payer: 59 | Admitting: Obstetrics & Gynecology

## 2015-04-24 ENCOUNTER — Encounter: Payer: 59 | Admitting: Obstetrics & Gynecology

## 2015-04-24 DIAGNOSIS — IMO0002 Reserved for concepts with insufficient information to code with codable children: Secondary | ICD-10-CM | POA: Insufficient documentation

## 2015-04-25 NOTE — Progress Notes (Signed)
This encounter was created in error - please disregard.

## 2015-05-05 ENCOUNTER — Ambulatory Visit (INDEPENDENT_AMBULATORY_CARE_PROVIDER_SITE_OTHER): Payer: 59 | Admitting: Family

## 2015-05-05 VITALS — BP 122/68 | HR 94 | Wt 153.0 lb

## 2015-05-05 DIAGNOSIS — O283 Abnormal ultrasonic finding on antenatal screening of mother: Secondary | ICD-10-CM

## 2015-05-05 DIAGNOSIS — Z3403 Encounter for supervision of normal first pregnancy, third trimester: Secondary | ICD-10-CM

## 2015-05-05 DIAGNOSIS — Z36 Encounter for antenatal screening of mother: Secondary | ICD-10-CM | POA: Diagnosis not present

## 2015-05-05 DIAGNOSIS — O289 Unspecified abnormal findings on antenatal screening of mother: Secondary | ICD-10-CM

## 2015-05-05 DIAGNOSIS — Z3483 Encounter for supervision of other normal pregnancy, third trimester: Secondary | ICD-10-CM

## 2015-05-05 DIAGNOSIS — Z3490 Encounter for supervision of normal pregnancy, unspecified, unspecified trimester: Secondary | ICD-10-CM

## 2015-05-05 LAB — CBC
HCT: 35 % (ref 35.0–45.0)
Hemoglobin: 11.8 g/dL (ref 11.7–15.5)
MCH: 31.1 pg (ref 27.0–33.0)
MCHC: 33.7 g/dL (ref 32.0–36.0)
MCV: 92.1 fL (ref 80.0–100.0)
MPV: 11.6 fL (ref 7.5–12.5)
PLATELETS: 124 10*3/uL — AB (ref 140–400)
RBC: 3.8 MIL/uL (ref 3.80–5.10)
RDW: 13.4 % (ref 11.0–15.0)
WBC: 13.1 10*3/uL — ABNORMAL HIGH (ref 3.8–10.8)

## 2015-05-05 NOTE — Progress Notes (Signed)
Subjective:  Ariel Andrews is a 25 y.o. G1P0 at 3670w5d being seen today for ongoing prenatal care.  She is currently monitored for the following issues for this low-risk pregnancy and has Anxiety state; Supervision of normal pregnancy, antepartum; Psychogenic vaginismus; Abnormal fetal ultrasound; Choroid plexus cyst of fetus; Echogenic intracardiac focus of fetus on prenatal ultrasound; and Fetal pyelectasis on her problem list.  Patient reports round ligament pain.  Contractions: Not present. Vag. Bleeding: None.  Movement: Present. Denies leaking of fluid.   The following portions of the patient's history were reviewed and updated as appropriate: allergies, current medications, past family history, past medical history, past social history, past surgical history and problem list. Problem list updated.  Objective:   Filed Vitals:   05/05/15 0842  BP: 122/68  Pulse: 94  Weight: 153 lb (69.4 kg)    Fetal Status: Fetal Heart Rate (bpm): 140 Fundal Height: 30 cm Movement: Present     General:  Alert, oriented and cooperative. Patient is in no acute distress.  Skin: Skin is warm and dry. No rash noted.   Cardiovascular: Normal heart rate noted  Respiratory: Normal respiratory effort, no problems with respiration noted  Abdomen: Soft, gravid, appropriate for gestational age. Pain/Pressure: Present     Pelvic: Vag. Bleeding: None Vag D/C Character: Thin   Cervical exam deferred        Extremities: Normal range of motion.  Edema: None  Mental Status: Normal mood and affect. Normal behavior. Normal judgment and thought content.   Urinalysis: Urine Protein: Negative Urine Glucose: Trace  Assessment and Plan:  Pregnancy: G1P0 at 5770w5d  1. Prenatal care, unspecified trimester - Glucose Tolerance, 1 HR (50g) - RPR - HIV antibody (with reflex) - CBC  2.  Abnormal fetal ultrasound - Follow-up ultrasound scheduled for 05/11/15  Preterm labor symptoms and general obstetric precautions  including but not limited to vaginal bleeding, contractions, leaking of fluid and fetal movement were reviewed in detail with the patient. Please refer to After Visit Summary for other counseling recommendations.  Return for [employer].   Ariel Andrews, CNM

## 2015-05-06 ENCOUNTER — Encounter: Payer: Self-pay | Admitting: Family

## 2015-05-06 DIAGNOSIS — D696 Thrombocytopenia, unspecified: Secondary | ICD-10-CM | POA: Insufficient documentation

## 2015-05-06 DIAGNOSIS — O99119 Other diseases of the blood and blood-forming organs and certain disorders involving the immune mechanism complicating pregnancy, unspecified trimester: Secondary | ICD-10-CM | POA: Insufficient documentation

## 2015-05-06 LAB — RPR

## 2015-05-06 LAB — HIV ANTIBODY (ROUTINE TESTING W REFLEX): HIV: NONREACTIVE

## 2015-05-06 LAB — GLUCOSE TOLERANCE, 1 HOUR (50G) W/O FASTING: GLUCOSE, 1 HR, GESTATIONAL: 135 mg/dL (ref <140)

## 2015-05-08 ENCOUNTER — Telehealth: Payer: Self-pay | Admitting: *Deleted

## 2015-05-08 NOTE — Telephone Encounter (Signed)
Pt notified of abnormal 1 hr GTT and pt is scheduled for a fasting 3 hr GTT.

## 2015-05-11 ENCOUNTER — Other Ambulatory Visit: Payer: 59

## 2015-05-11 ENCOUNTER — Ambulatory Visit (HOSPITAL_COMMUNITY)
Admission: RE | Admit: 2015-05-11 | Discharge: 2015-05-11 | Disposition: A | Discharge status: Home / Self Care | Payer: 59 | Source: Ambulatory Visit | Attending: Family | Admitting: Family

## 2015-05-11 ENCOUNTER — Encounter (HOSPITAL_COMMUNITY): Payer: Self-pay

## 2015-05-11 VITALS — BP 118/62 | HR 86 | Wt 157.5 lb

## 2015-05-11 DIAGNOSIS — O99119 Other diseases of the blood and blood-forming organs and certain disorders involving the immune mechanism complicating pregnancy, unspecified trimester: Secondary | ICD-10-CM

## 2015-05-11 DIAGNOSIS — IMO0001 Reserved for inherently not codable concepts without codable children: Secondary | ICD-10-CM

## 2015-05-11 DIAGNOSIS — Z36 Encounter for antenatal screening of mother: Secondary | ICD-10-CM | POA: Insufficient documentation

## 2015-05-11 DIAGNOSIS — Z3483 Encounter for supervision of other normal pregnancy, third trimester: Secondary | ICD-10-CM

## 2015-05-11 DIAGNOSIS — O358XX0 Maternal care for other (suspected) fetal abnormality and damage, not applicable or unspecified: Secondary | ICD-10-CM | POA: Diagnosis present

## 2015-05-11 DIAGNOSIS — R7309 Other abnormal glucose: Secondary | ICD-10-CM

## 2015-05-11 DIAGNOSIS — Z3A30 30 weeks gestation of pregnancy: Secondary | ICD-10-CM | POA: Diagnosis not present

## 2015-05-11 DIAGNOSIS — O283 Abnormal ultrasonic finding on antenatal screening of mother: Secondary | ICD-10-CM

## 2015-05-11 DIAGNOSIS — O35EXX0 Maternal care for other (suspected) fetal abnormality and damage, fetal genitourinary anomalies, not applicable or unspecified: Secondary | ICD-10-CM

## 2015-05-11 DIAGNOSIS — D696 Thrombocytopenia, unspecified: Secondary | ICD-10-CM

## 2015-05-12 LAB — GLUCOSE TOLERANCE, 3 HOURS
GLUCOSE 3 HOUR GTT: 49 mg/dL — AB (ref <145)
GLUCOSE, 1 HOUR-GESTATIONAL: 112 mg/dL (ref <190)
GLUCOSE, 2 HOUR-GESTATIONAL: 100 mg/dL (ref <165)
GLUCOSE, FASTING-GESTATIONAL: 77 mg/dL (ref 65–104)

## 2015-05-22 ENCOUNTER — Encounter: Payer: 59 | Admitting: Obstetrics & Gynecology

## 2015-06-05 ENCOUNTER — Other Ambulatory Visit: Payer: Self-pay | Admitting: Obstetrics & Gynecology

## 2015-06-20 ENCOUNTER — Inpatient Hospital Stay (HOSPITAL_COMMUNITY)
Admission: AD | Admit: 2015-06-20 | Discharge: 2015-06-20 | Disposition: A | Discharge status: Home / Self Care | Payer: 59 | Source: Ambulatory Visit | Attending: Obstetrics and Gynecology | Admitting: Obstetrics and Gynecology

## 2015-06-20 ENCOUNTER — Encounter (HOSPITAL_COMMUNITY): Payer: Self-pay | Admitting: Advanced Practice Midwife

## 2015-06-20 DIAGNOSIS — Z3483 Encounter for supervision of other normal pregnancy, third trimester: Secondary | ICD-10-CM

## 2015-06-20 DIAGNOSIS — IMO0001 Reserved for inherently not codable concepts without codable children: Secondary | ICD-10-CM

## 2015-06-20 DIAGNOSIS — Z3493 Encounter for supervision of normal pregnancy, unspecified, third trimester: Secondary | ICD-10-CM | POA: Insufficient documentation

## 2015-06-20 DIAGNOSIS — O99119 Other diseases of the blood and blood-forming organs and certain disorders involving the immune mechanism complicating pregnancy, unspecified trimester: Secondary | ICD-10-CM

## 2015-06-20 DIAGNOSIS — O283 Abnormal ultrasonic finding on antenatal screening of mother: Secondary | ICD-10-CM

## 2015-06-20 DIAGNOSIS — D696 Thrombocytopenia, unspecified: Secondary | ICD-10-CM

## 2015-06-20 NOTE — Discharge Instructions (Signed)
Braxton Hicks Contractions °Contractions of the uterus can occur throughout pregnancy. Contractions are not always a sign that you are in labor.  °WHAT ARE BRAXTON HICKS CONTRACTIONS?  °Contractions that occur before labor are called Braxton Hicks contractions, or false labor. Toward the end of pregnancy (32-34 weeks), these contractions can develop more often and may become more forceful. This is not true labor because these contractions do not result in opening (dilatation) and thinning of the cervix. They are sometimes difficult to tell apart from true labor because these contractions can be forceful and people have different pain tolerances. You should not feel embarrassed if you go to the hospital with false labor. Sometimes, the only way to tell if you are in true labor is for your health care provider to look for changes in the cervix. °If there are no prenatal problems or other health problems associated with the pregnancy, it is completely safe to be sent home with false labor and await the onset of true labor. °HOW CAN YOU TELL THE DIFFERENCE BETWEEN TRUE AND FALSE LABOR? °False Labor °· The contractions of false labor are usually shorter and not as hard as those of true labor.   °· The contractions are usually irregular.   °· The contractions are often felt in the front of the lower abdomen and in the groin.   °· The contractions may go away when you walk around or change positions while lying down.   °· The contractions get weaker and are shorter lasting as time goes on.   °· The contractions do not usually become progressively stronger, regular, and closer together as with true labor.   °True Labor °1. Contractions in true labor last 30-70 seconds, become very regular, usually become more intense, and increase in frequency.   °2. The contractions do not go away with walking.   °3. The discomfort is usually felt in the top of the uterus and spreads to the lower abdomen and low back.   °4. True labor can  be determined by your health care provider with an exam. This will show that the cervix is dilating and getting thinner.   °WHAT TO REMEMBER °· Keep up with your usual exercises and follow other instructions given by your health care provider.   °· Take medicines as directed by your health care provider.   °· Keep your regular prenatal appointments.   °· Eat and drink lightly if you think you are going into labor.   °· If Braxton Hicks contractions are making you uncomfortable:   °· Change your position from lying down or resting to walking, or from walking to resting.   °· Sit and rest in a tub of warm water.   °· Drink 2-3 glasses of water. Dehydration may cause these contractions.   °· Do slow and deep breathing several times an hour.   °WHEN SHOULD I SEEK IMMEDIATE MEDICAL CARE? °Seek immediate medical care if: °· Your contractions become stronger, more regular, and closer together.   °· You have fluid leaking or gushing from your vagina.   °· You have a fever.   °· You pass blood-tinged mucus.   °· You have vaginal bleeding.   °· You have continuous abdominal pain.   °· You have low back pain that you never had before.   °· You feel your baby's head pushing down and causing pelvic pressure.   °· Your baby is not moving as much as it used to.   °  °This information is not intended to replace advice given to you by your health care provider. Make sure you discuss any questions you have with your health care   provider. °  °Document Released: 12/31/2004 Document Revised: 01/05/2013 Document Reviewed: 10/12/2012 °Elsevier Interactive Patient Education ©2016 [employer] ° °Fetal Movement Counts °Patient Name: __________________________________________________ Patient Due Date: ____________________ °Performing a fetal movement count is highly recommended in high-risk pregnancies, but it is good for every pregnant woman to do. Your health care provider may ask you to start counting fetal movements at 28 weeks of the  pregnancy. Fetal movements often increase: °· After eating a full meal. °· After physical activity. °· After eating or drinking something sweet or cold. °· At rest. °Pay attention to when you feel the baby is most active. This will help you notice a pattern of your baby's sleep and wake cycles and what factors contribute to an increase in fetal movement. It is important to perform a fetal movement count at the same time each day when your baby is normally most active.  °HOW TO COUNT FETAL MOVEMENTS °5. Find a quiet and comfortable area to sit or lie down on your left side. Lying on your left side provides the best blood and oxygen circulation to your baby. °6. Write down the day and time on a sheet of paper or in a journal. °7. Start counting kicks, flutters, swishes, rolls, or jabs in a 2-hour period. You should feel at least 10 movements within 2 hours. °8. If you do not feel 10 movements in 2 hours, wait 2-3 hours and count again. Look for a change in the pattern or not enough counts in 2 hours. °SEEK MEDICAL CARE IF: °· You feel less than 10 counts in 2 hours, tried twice. °· There is no movement in over an hour. °· The pattern is changing or taking longer each day to reach 10 counts in 2 hours. °· You feel the baby is not moving as he or she usually does. °Date: ____________ Movements: ____________ Start time: ____________ Finish time: ____________  °Date: ____________ Movements: ____________ Start time: ____________ Finish time: ____________ °Date: ____________ Movements: ____________ Start time: ____________ Finish time: ____________ °Date: ____________ Movements: ____________ Start time: ____________ Finish time: ____________ °Date: ____________ Movements: ____________ Start time: ____________ Finish time: ____________ °Date: ____________ Movements: ____________ Start time: ____________ Finish time: ____________ °Date: ____________ Movements: ____________ Start time: ____________ Finish time:  ____________ °Date: ____________ Movements: ____________ Start time: ____________ Finish time: ____________  °Date: ____________ Movements: ____________ Start time: ____________ Finish time: ____________ °Date: ____________ Movements: ____________ Start time: ____________ Finish time: ____________ °Date: ____________ Movements: ____________ Start time: ____________ Finish time: ____________ °Date: ____________ Movements: ____________ Start time: ____________ Finish time: ____________ °Date: ____________ Movements: ____________ Start time: ____________ Finish time: ____________ °Date: ____________ Movements: ____________ Start time: ____________ Finish time: ____________ °Date: ____________ Movements: ____________ Start time: ____________ Finish time: ____________  °Date: ____________ Movements: ____________ Start time: ____________ Finish time: ____________ °Date: ____________ Movements: ____________ Start time: ____________ Finish time: ____________ °Date: ____________ Movements: ____________ Start time: ____________ Finish time: ____________ °Date: ____________ Movements: ____________ Start time: ____________ Finish time: ____________ °Date: ____________ Movements: ____________ Start time: ____________ Finish time: ____________ °Date: ____________ Movements: ____________ Start time: ____________ Finish time: ____________ °Date: ____________ Movements: ____________ Start time: ____________ Finish time: ____________  °Date: ____________ Movements: ____________ Start time: ____________ Finish time: ____________ °Date: ____________ Movements: ____________ Start time: ____________ Finish time: ____________ °Date: ____________ Movements: ____________ Start time: ____________ Finish time: ____________ °Date: ____________ Movements: ____________ Start time: ____________ Finish time: ____________ °Date: ____________ Movements: ____________ Start time: ____________ Finish time: ____________ °Date: ____________ Movements:  ____________ Start time: ____________ Finish   time: ____________ °Date: ____________ Movements: ____________ Start time: ____________ Finish time: ____________  °Date: ____________ Movements: ____________ Start time: ____________ Finish time: ____________ °Date: ____________ Movements: ____________ Start time: ____________ Finish time: ____________ °Date: ____________ Movements: ____________ Start time: ____________ Finish time: ____________ °Date: ____________ Movements: ____________ Start time: ____________ Finish time: ____________ °Date: ____________ Movements: ____________ Start time: ____________ Finish time: ____________ °Date: ____________ Movements: ____________ Start time: ____________ Finish time: ____________ °Date: ____________ Movements: ____________ Start time: ____________ Finish time: ____________  °Date: ____________ Movements: ____________ Start time: ____________ Finish time: ____________ °Date: ____________ Movements: ____________ Start time: ____________ Finish time: ____________ °Date: ____________ Movements: ____________ Start time: ____________ Finish time: ____________ °Date: ____________ Movements: ____________ Start time: ____________ Finish time: ____________ °Date: ____________ Movements: ____________ Start time: ____________ Finish time: ____________ °Date: ____________ Movements: ____________ Start time: ____________ Finish time: ____________ °Date: ____________ Movements: ____________ Start time: ____________ Finish time: ____________  °Date: ____________ Movements: ____________ Start time: ____________ Finish time: ____________ °Date: ____________ Movements: ____________ Start time: ____________ Finish time: ____________ °Date: ____________ Movements: ____________ Start time: ____________ Finish time: ____________ °Date: ____________ Movements: ____________ Start time: ____________ Finish time: ____________ °Date: ____________ Movements: ____________ Start time: ____________ Finish  time: ____________ °Date: ____________ Movements: ____________ Start time: ____________ Finish time: ____________ °Date: ____________ Movements: ____________ Start time: ____________ Finish time: ____________  °Date: ____________ Movements: ____________ Start time: ____________ Finish time: ____________ °Date: ____________ Movements: ____________ Start time: ____________ Finish time: ____________ °Date: ____________ Movements: ____________ Start time: ____________ Finish time: ____________ °Date: ____________ Movements: ____________ Start time: ____________ Finish time: ____________ °Date: ____________ Movements: ____________ Start time: ____________ Finish time: ____________ °Date: ____________ Movements: ____________ Start time: ____________ Finish time: ____________ °  °This information is not intended to replace advice given to you by your health care provider. Make sure you discuss any questions you have with your health care provider. °  °Document Released: 01/30/2006 Document Revised: 01/21/2014 Document Reviewed: 10/28/2011 °Elsevier Interactive Patient Education ©2016 [employer] Inc[employer] ° °

## 2015-06-20 NOTE — MAU Note (Signed)
Pt states she had random diarrhea x 1 yesterday @ 1400, began having lower back pain @ 2330 - became more intense during the night.  Also having abdominal tightening every 3 minutes.  Denies bleeding or LOF.

## 2015-06-30 ENCOUNTER — Inpatient Hospital Stay (HOSPITAL_COMMUNITY)
Admission: AD | Admit: 2015-06-30 | Discharge: 2015-06-30 | Disposition: A | Discharge status: Home / Self Care | Payer: 59 | Source: Ambulatory Visit | Attending: Obstetrics & Gynecology | Admitting: Obstetrics & Gynecology

## 2015-06-30 ENCOUNTER — Ambulatory Visit (INDEPENDENT_AMBULATORY_CARE_PROVIDER_SITE_OTHER): Payer: 59 | Admitting: Family

## 2015-06-30 ENCOUNTER — Encounter (HOSPITAL_COMMUNITY): Payer: Self-pay

## 2015-06-30 VITALS — BP 127/74 | HR 85 | Wt 169.0 lb

## 2015-06-30 DIAGNOSIS — O99119 Other diseases of the blood and blood-forming organs and certain disorders involving the immune mechanism complicating pregnancy, unspecified trimester: Secondary | ICD-10-CM

## 2015-06-30 DIAGNOSIS — Z36 Encounter for antenatal screening of mother: Secondary | ICD-10-CM

## 2015-06-30 DIAGNOSIS — O471 False labor at or after 37 completed weeks of gestation: Secondary | ICD-10-CM | POA: Insufficient documentation

## 2015-06-30 DIAGNOSIS — O283 Abnormal ultrasonic finding on antenatal screening of mother: Secondary | ICD-10-CM

## 2015-06-30 DIAGNOSIS — N898 Other specified noninflammatory disorders of vagina: Secondary | ICD-10-CM | POA: Diagnosis not present

## 2015-06-30 DIAGNOSIS — Z3A37 37 weeks gestation of pregnancy: Secondary | ICD-10-CM | POA: Diagnosis not present

## 2015-06-30 DIAGNOSIS — O479 False labor, unspecified: Secondary | ICD-10-CM

## 2015-06-30 DIAGNOSIS — Z3493 Encounter for supervision of normal pregnancy, unspecified, third trimester: Secondary | ICD-10-CM

## 2015-06-30 DIAGNOSIS — O26893 Other specified pregnancy related conditions, third trimester: Secondary | ICD-10-CM | POA: Diagnosis present

## 2015-06-30 DIAGNOSIS — Z113 Encounter for screening for infections with a predominantly sexual mode of transmission: Secondary | ICD-10-CM | POA: Diagnosis not present

## 2015-06-30 DIAGNOSIS — Z3483 Encounter for supervision of other normal pregnancy, third trimester: Secondary | ICD-10-CM

## 2015-06-30 DIAGNOSIS — IMO0002 Reserved for concepts with insufficient information to code with codable children: Secondary | ICD-10-CM

## 2015-06-30 DIAGNOSIS — Q620 Congenital hydronephrosis: Secondary | ICD-10-CM

## 2015-06-30 DIAGNOSIS — O99113 Other diseases of the blood and blood-forming organs and certain disorders involving the immune mechanism complicating pregnancy, third trimester: Secondary | ICD-10-CM

## 2015-06-30 DIAGNOSIS — F525 Vaginismus not due to a substance or known physiological condition: Secondary | ICD-10-CM

## 2015-06-30 DIAGNOSIS — D696 Thrombocytopenia, unspecified: Secondary | ICD-10-CM

## 2015-06-30 LAB — CBC
HEMATOCRIT: 35.6 % — AB (ref 36.0–46.0)
HEMOGLOBIN: 12.4 g/dL (ref 12.0–15.0)
MCH: 31.2 pg (ref 26.0–34.0)
MCHC: 34.8 g/dL (ref 30.0–36.0)
MCV: 89.4 fL (ref 78.0–100.0)
Platelets: 130 10*3/uL — ABNORMAL LOW (ref 150–400)
RBC: 3.98 MIL/uL (ref 3.87–5.11)
RDW: 14.2 % (ref 11.5–15.5)
WBC: 12.7 10*3/uL — AB (ref 4.0–10.5)

## 2015-06-30 LAB — AMNISURE RUPTURE OF MEMBRANE (ROM) NOT AT ARMC
Amnisure ROM: NEGATIVE
Amnisure ROM: NEGATIVE

## 2015-06-30 LAB — OB RESULTS CONSOLE GBS: GBS: POSITIVE

## 2015-06-30 LAB — OB RESULTS CONSOLE GC/CHLAMYDIA: Gonorrhea: NEGATIVE

## 2015-06-30 NOTE — Progress Notes (Signed)
Subjective:  Ariel ManifoldJessica Andrews is a 25 y.o. G1P0 at 2779w5d being seen today for ongoing prenatal care.  She is currently monitored for the following issues for this low-risk pregnancy and has Anxiety state; Supervision of normal pregnancy, antepartum; Psychogenic vaginismus; Abnormal fetal ultrasound; Choroid plexus cyst of fetus; Echogenic intracardiac focus of fetus on prenatal ultrasound; Fetal pyelectasis; and Thrombocytopenia affecting pregnancy (HCC) on her problem list.  Patient reports reports wet underwear that started last week.  Irregular Braxton Hicks.  Contractions: Irritability. Vag. Bleeding: None.  Movement: Present. Denies leaking of fluid.   The following portions of the patient's history were reviewed and updated as appropriate: allergies, current medications, past family history, past medical history, past social history, past surgical history and problem list. Problem list updated.  Objective:   Filed Vitals:   06/30/15 0916  BP: 127/74  Pulse: 85  Weight: 169 lb (76.658 kg)    Fetal Status:     Movement: Present     General:  Alert, oriented and cooperative. Patient is in no acute distress.  Skin: Skin is warm and dry. No rash noted.   Cardiovascular: Normal heart rate noted  Respiratory: Normal respiratory effort, no problems with respiration noted  Abdomen: Soft, gravid, appropriate for gestational age. Pain/Pressure: Present     Pelvic: Cervical exam performed      +nitrazine test; unable to do spec due to anxiety  Extremities: Normal range of motion.  Edema: Trace  Mental Status: Normal mood and affect. Normal behavior. Normal judgment and thought content.   Urinalysis: Urine Protein: Trace Urine Glucose: Negative  Assessment and Plan:  Pregnancy: G1P0 at 6479w5d  1. Normal pregnancy, third trimester - Culture, beta strep (group b only) - Urine cytology ancillary only  2. Supervision of normal pregnancy, antepartum, third trimester - GBS obtained today  3.  Thrombocytopenia complicating pregnancy, third trimester - CBC at MAU  4. Fetal pyelectasis - Resolved  6. Psychogenic vaginismus - Proceed with vaginal exam slowly  7. Echogenic intracardiac focus of fetus on prenatal ultrasound - Resolved  8.  Vaginal Discharge - Due to +nitrazine and history > MAU for amnisure  Term labor symptoms and general obstetric precautions including but not limited to vaginal bleeding, contractions, leaking of fluid and fetal movement were reviewed in detail with the patient. Please refer to After Visit Summary for other counseling recommendations.  Return in about 1 week (around 07/07/2015).   Eino FarberWalidah Kennith GainN Karim, CNM

## 2015-06-30 NOTE — MAU Provider Note (Signed)
Chief Complaint:  Rupture of Membranes   First Provider Initiated Contact with Patient 06/30/15 1317      HPI: Ariel Andrews is a 25 y.o. G1P0 at [redacted]w[redacted]d who presents to maternity admissions reporting leakage of clear fluid x 1 week. She was seen in [place] office today and had positive nitrazine swab.  Her cervix was closed.  She reports the leakage is enough to wet her underwear but has not required a pad.  It has no odor and is watery, not thick like the normal mucus discharge.  She has not tried any treatments. She has occasional mild cramps/braxton-hicks contractions that are unchanged since the leakage started. Of note, she does not tolerate pelvic exams very well. She has never had a pap and they were unable to complete a speculum exam in the [place] office during her initial prenatal visit.  She did tolerate a cervical exam today for the first time but reports it took time and the midwife had to help her relax and go slowly.  She denies any history of sexual abuse or assault and reports that intercourse is normal and not painful.  She denies concerns about vaginal delivery and reports looking forward to having her baby.  She does report that her first pelvic exam was at age 87 when she was a virgin and included a bimanual exam which was extremely painful, causing her to cry.   She reports good fetal movement, vaginal bleeding, vaginal itching/burning, urinary symptoms, h/a, dizziness, n/v, or fever/chills.    HPI  Past Medical History: History reviewed. No pertinent past medical history.  Past obstetric history: OB History  Gravida Para Term Preterm AB SAB TAB Ectopic Multiple Living  1             # Outcome Date GA Lbr Len/2nd Weight Sex Delivery Anes PTL Lv  1 Current               Past Surgical History: Past Surgical History  Procedure Laterality Date  . Cyst excision      Family History: History reviewed. No pertinent family history.  Social History: Social  History  Substance Use Topics  . Smoking status: Never Smoker   . Smokeless tobacco: None  . Alcohol Use: No    Allergies: No Known Allergies  Meds:  Prescriptions prior to admission  Medication Sig Dispense Refill Last Dose  . docusate sodium (COLACE) 100 MG capsule TAKE 1 CAPSULE (100 MG TOTAL) BY MOUTH 2 (TWO) TIMES DAILY AS NEEDED. 30 capsule 2 06/29/2015 at Unknown time  . Prenatal Vit-Fe Fumarate-FA (PRENATAL VITAMIN PLUS LOW IRON) 27-1 MG TABS Take 1 tablet by mouth daily. 30 tablet 11 Past Week at Unknown time    ROS:  Review of Systems  Constitutional: Negative for fever, chills and fatigue.  Eyes: Negative for visual disturbance.  Respiratory: Negative for shortness of breath.   Cardiovascular: Negative for chest pain.  Gastrointestinal: Negative for nausea, vomiting and abdominal pain.  Genitourinary: Positive for vaginal discharge. Negative for dysuria, flank pain, vaginal bleeding, difficulty urinating, vaginal pain and pelvic pain.  Neurological: Negative for dizziness and headaches.  Psychiatric/Behavioral: Negative.      I have reviewed patient's Past Medical Hx, Surgical Hx, Family Hx, Social Hx, medications and allergies.   Physical Exam   Patient Vitals for the past 24 hrs:  BP Temp src Pulse Resp Height Weight  06/30/15 1213 127/84 mmHg Oral 105 16  (1.6 m) 169 lb (76.658 kg)   Constitutional:  Well-developed, well-nourished female in no acute distress.  Cardiovascular: normal rate Respiratory: normal effort GI: Abd soft, non-tender, gravid appropriate for gestational age.  MS: Extremities nontender, no edema, normal ROM Neurologic: Alert and oriented x 4.  GU: Neg CVAT.  PELVIC EXAM: Cervix pink, visually closed, without lesion, moderate amount white watery discharge, vaginal walls and external genitalia normal Pt had vaginal spasms and tension of gluteus and pelvic floor muscles at onset and off and on throughout exam.  Pt had difficulty but   tolerated exam with relaxation techniques.  No pooling of fluid with valsalva, new amnisure collected.    Cervical exam deferred--pt was closed in office today     FHT:  Baseline 130, moderate variability, accelerations present, no decelerations Contractions: rare, mild to palpation   Labs: Results for orders placed or performed during the hospital encounter of 06/30/15 (from the past 24 hour(s))  CBC     Status: Abnormal   Collection Time: 06/30/15 12:15 PM  Result Value Ref Range   WBC 12.7 (H) 4.0 - 10.5 K/uL   RBC 3.98 3.87 - 5.11 MIL/uL   Hemoglobin 12.4 12.0 - 15.0 g/dL   HCT 16.135.6 (L) 09.636.0 - 04.546.0 %   MCV 89.4 78.0 - 100.0 fL   MCH 31.2 26.0 - 34.0 pg   MCHC 34.8 30.0 - 36.0 g/dL   RDW 40.914.2 81.111.5 - 91.415.5 %   Platelets 130 (L) 150 - 400 K/uL  Amnisure rupture of membrane (rom)not [facility]     Status: None   Collection Time: 06/30/15 12:25 PM  Result Value Ref Range   Amnisure ROM NEGATIVE   Am[facility]isure [facility] (rom[facility]     Status: None   Collection Time: 06/30/15  2:18 PM  Result Value Ref Range   Amnisure ROM NEGATIVE    O/POS/-- (11/29 1542)  Imaging:  No results found.  MAU Course/MDM: RN initially collected amnisure with blind swab but because pt did not tolerate insertion of swab well, unsure if appropriate sample was taken.  Discussed options with pt, including SSE for pooling and/or amnisure and Limited OB US for AFI. Obtaining AFI may rule in rupture of membranes if she is oligo at term but will not rule out ROM if AFI wnl.  Pt opted to have SSE if provider will go slowly and work with her.  See above note about exam which was performed with success.  Discussed recommendations for pt to continue to work on what may be an anxiety reaction based on previous pelvic exam at age 25.  Second amnisure negative so no evidence of ROM or preterm labor today.  Adjusted pt EDD because 9 week US 4 days different from LMP.  Pt reports she is now sure of LMP because  her sister was with her that day and confirmed the date so per ACOG guidelines LMP is supported by 9 week US. Also pt happy with midwives she saw today so would like to be midwife preferred.  Pt stable at time of discharge.  Assessment: 1. Threatened labor at term   2. [redacted] weeks gestation of pregnancy   3. Vaginal discharge during pregnancy in third trimester     Plan: Discharge home Labor precautions and fetal kick counts      Follow-up Information    [facility] [place].   Specialty:  Obstetrics and Gynecology   Why:  As scheduled, Return to MAU as needed for emergencies  Contact information:   [address] [place] [address] [phone]       Medication List    TAKE these medications        docusate sodium 100 MG capsule  Commonly known as:  COLACE  TAKE 1 CAPSULE (100 MG TOTAL) BY MOUTH 2 (TWO) TIMES DAILY AS NEEDED.     PRENATAL VITAMIN PLUS LOW IRON 27-1 MG Tabs  Take 1 tablet by mouth daily.        Sharen Counter Certified Nurse-Midwife 06/30/2015 3:08 PM

## 2015-06-30 NOTE — MAU Note (Signed)
[facility], thinks starting leaking a week ago, denies contractions, positive FM

## 2015-06-30 NOTE — Progress Notes (Signed)
Panties wet on most days

## 2015-06-30 NOTE — Discharge Instructions (Signed)
Reasons to return to [facility]:  1.  Contractions are  5 minutes apart or less, each last 1 minute, these have been going on for 1-2 hours, and you cannot walk or talk during them 2.  You have a large gush of fluid, or a trickle of fluid that will not stop and you have to wear a pad 3.  You have bleeding that is bright red, heavier than spotting--like menstrual bleeding (spotting can be normal in early labor or after a check of your cervix) 4.  You do not feel the baby moving like he/she normally does.  Fetal Movement Counts Patient Name: __________________________________________________ Patient Due Date: ____________________ Performing a fetal movement count is highly recommended in high-risk pregnancies, but it is good for every pregnant woman to do. Your health care provider may ask you to start counting fetal movements at 28 weeks of the pregnancy. Fetal movements often increase:  After eating a full meal.  After physical activity.  After eating or drinking something sweet or cold.  At rest. Pay attention to when you feel the baby is most active. This will help you notice a pattern of your baby's sleep and wake cycles and what factors contribute to an increase in fetal movement. It is important to perform a fetal movement count at the same time each day when your baby is normally most active.  HOW TO COUNT FETAL MOVEMENTS 1. Find a quiet and comfortable area to sit or lie down on your left side. Lying on your left side provides the best blood and oxygen circulation to your baby. 2. Write down the day and time on a sheet of paper or in a journal. 3. Start counting kicks, flutters, swishes, rolls, or jabs in a 2-hour period. You should feel at least 10 movements within 2 hours. 4. If you do not feel 10 movements in 2 hours, wait 2-3 hours and count again. Look for a change in the pattern or not enough counts in 2 hours. SEEK MEDICAL CARE IF:  You feel less than 10 counts in 2 hours, tried  twice.  There is no movement in over an hour.  The pattern is changing or taking longer each day to reach 10 counts in 2 hours.  You feel the baby is not moving as he or she usually does. Date: ____________ Movements: ____________ Start time: ____________ Doreatha Martin time: ____________  Date: ____________ Movements: ____________ Start time: ____________ Doreatha Martin time: ____________ Date: ____________ Movements: ____________ Start time: ____________ Doreatha Martin time: ____________ Date: ____________ Movements: ____________ Start time: ____________ Doreatha Martin time: ____________ Date: ____________ Movements: ____________ Start time: ____________ Doreatha Martin time: ____________ Date: ____________ Movements: ____________ Start time: ____________ Doreatha Martin time: ____________ Date: ____________ Movements: ____________ Start time: ____________ Doreatha Martin time: ____________ Date: ____________ Movements: ____________ Start time: ____________ Doreatha Martin time: ____________  Date: ____________ Movements: ____________ Start time: ____________ Doreatha Martin time: ____________ Date: ____________ Movements: ____________ Start time: ____________ Doreatha Martin time: ____________ Date: ____________ Movements: ____________ Start time: ____________ Doreatha Martin time: ____________ Date: ____________ Movements: ____________ Start time: ____________ Doreatha Martin time: ____________ Date: ____________ Movements: ____________ Start time: ____________ Doreatha Martin time: ____________ Date: ____________ Movements: ____________ Start time: ____________ Doreatha Martin time: ____________ Date: ____________ Movements: ____________ Start time: ____________ Doreatha Martin time: ____________  Date: ____________ Movements: ____________ Start time: ____________ Doreatha Martin time: ____________ Date: ____________ Movements: ____________ Start time: ____________ Doreatha Martin time: ____________ Date: ____________ Movements: ____________ Start time: ____________ Doreatha Martin time: ____________ Date: ____________ Movements:  ____________ Start time: ____________ Doreatha Martin time: ____________ Date: ____________ Movements: ____________ Start time: ____________  Finish time: ____________ Date: ____________ Movements: ____________ Start time: ____________ Doreatha Martin time: ____________ Date: ____________ Movements: ____________ Start time: ____________ Doreatha Martin time: ____________  Date: ____________ Movements: ____________ Start time: ____________ Doreatha Martin time: ____________ Date: ____________ Movements: ____________ Start time: ____________ Doreatha Martin time: ____________ Date: ____________ Movements: ____________ Start time: ____________ Doreatha Martin time: ____________ Date: ____________ Movements: ____________ Start time: ____________ Doreatha Martin time: ____________ Date: ____________ Movements: ____________ Start time: ____________ Doreatha Martin time: ____________ Date: ____________ Movements: ____________ Start time: ____________ Doreatha Martin time: ____________ Date: ____________ Movements: ____________ Start time: ____________ Doreatha Martin time: ____________  Date: ____________ Movements: ____________ Start time: ____________ Doreatha Martin time: ____________ Date: ____________ Movements: ____________ Start time: ____________ Doreatha Martin time: ____________ Date: ____________ Movements: ____________ Start time: ____________ Doreatha Martin time: ____________ Date: ____________ Movements: ____________ Start time: ____________ Doreatha Martin time: ____________ Date: ____________ Movements: ____________ Start time: ____________ Doreatha Martin time: ____________ Date: ____________ Movements: ____________ Start time: ____________ Doreatha Martin time: ____________ Date: ____________ Movements: ____________ Start time: ____________ Doreatha Martin time: ____________  Date: ____________ Movements: ____________ Start time: ____________ Doreatha Martin time: ____________ Date: ____________ Movements: ____________ Start time: ____________ Doreatha Martin time: ____________ Date: ____________ Movements: ____________ Start time: ____________ Doreatha Martin  time: ____________ Date: ____________ Movements: ____________ Start time: ____________ Doreatha Martin time: ____________ Date: ____________ Movements: ____________ Start time: ____________ Doreatha Martin time: ____________ Date: ____________ Movements: ____________ Start time: ____________ Doreatha Martin time: ____________ Date: ____________ Movements: ____________ Start time: ____________ Doreatha Martin time: ____________  Date: ____________ Movements: ____________ Start time: ____________ Doreatha Martin time: ____________ Date: ____________ Movements: ____________ Start time: ____________ Doreatha Martin time: ____________ Date: ____________ Movements: ____________ Start time: ____________ Doreatha Martin time: ____________ Date: ____________ Movements: ____________ Start time: ____________ Doreatha Martin time: ____________ Date: ____________ Movements: ____________ Start time: ____________ Doreatha Martin time: ____________ Date: ____________ Movements: ____________ Start time: ____________ Doreatha Martin time: ____________ Date: ____________ Movements: ____________ Start time: ____________ Doreatha Martin time: ____________  Date: ____________ Movements: ____________ Start time: ____________ Doreatha Martin time: ____________ Date: ____________ Movements: ____________ Start time: ____________ Doreatha Martin time: ____________ Date: ____________ Movements: ____________ Start time: ____________ Doreatha Martin time: ____________ Date: ____________ Movements: ____________ Start time: ____________ Doreatha Martin time: ____________ Date: ____________ Movements: ____________ Start time: ____________ Doreatha Martin time: ____________ Date: ____________ Movements: ____________ Start time: ____________ Doreatha Martin time: ____________   This information is not intended to replace advice given to you by your health care provider. Make sure you discuss any questions you have with your health care provider.   Document Released: 01/30/2006 Document Revised: 01/21/2014 Document Reviewed: 10/28/2011 Elsevier Interactive Patient Education 2016  [employer]. Vaginal Delivery During delivery, your health care provider will help you give birth to your baby. During a vaginal delivery, you will work to push the baby out of your vagina. However, before you can push your baby out, a few things need to happen. The opening of your uterus (cervix) has to soften, thin out, and open up (dilate) all the way to 10 cm. Also, your baby has to move down from the uterus into your vagina.  SIGNS OF LABOR  Your health care provider will first need to make sure you are in labor. Signs of labor include:   Passing what is called the mucous plug before labor begins. This is a small amount of blood-stained mucus.  Having regular, painful uterine contractions.   The time between contractions gets shorter.   The discomfort and pain gradually get more intense.  Contraction pains get worse when walking and do not go away when resting.   Your cervix becomes thinner (effacement) and dilates. BEFORE THE DELIVERY Once you are in labor and admitted into the  hospital or care center, your health care provider may do the following:  5. Perform a complete physical exam. 6. Review any complications related to pregnancy or labor. 7. Check your blood pressure, pulse, temperature, and heart rate (vital signs).  8. Determine if, and when, the rupture of amniotic membranes occurred. 9. Do a vaginal exam (using a sterile glove and lubricant) to determine:  1. The position (presentation) of the baby. Is the baby's head presenting first (vertex) in the birth canal (vagina), or are the feet or buttocks first (breech)?  2. The level (station) of the baby's head within the birth canal.  3. The effacement and dilatation of the cervix.  10. An electronic fetal monitor is usually placed on your abdomen when you first arrive. This is used to monitor your contractions and the baby's heart rate. 1. When the monitor is on your abdomen (external fetal monitor), it can only  pick up the frequency and length of your contractions. It cannot tell the strength of your contractions. 2. If it becomes necessary for your health care provider to know exactly how strong your contractions are or to see exactly what the baby's heart rate is doing, an internal monitor may be inserted into your vagina and uterus. Your health care provider will discuss the benefits and risks of using an internal monitor and obtain your permission before inserting the device. 3. Continuous fetal monitoring may be needed if you have an epidural, are receiving certain medicines (such as oxytocin), or have pregnancy or labor complications. 11. An IV access tube may be placed into a vein in your arm to deliver fluids and medicines if necessary. THREE STAGES OF LABOR AND DELIVERY Normal labor and delivery is divided into three stages. First Stage This stage starts when you begin to contract regularly and your cervix begins to efface and dilate. It ends when your cervix is completely open (fully dilated). The first stage is the longest stage of labor and can last from 3 hours to 15 hours.  Several methods are available to help with labor pain. You and your health care provider will decide which option is best for you. Options include:   Opioid medicines. These are strong pain medicines that you can get through your IV tube or as a shot into your muscle. These medicines lessen pain but do not make it go away completely.  Epidural. A medicine is given through a thin tube that is inserted in your back. The medicine numbs the lower part of your body and prevents any pain in that area.  Paracervical pain medicine. This is an injection of an anesthetic on each side of your cervix.   You may request natural childbirth, which does not involve the use of pain medicines or an epidural during labor and delivery. Instead, you will use other things, such as breathing exercises, to help cope with the pain. Second  Stage The second stage of labor begins when your cervix is fully dilated at 10 cm. It continues until you push your baby down through the birth canal and the baby is born. This stage can take only minutes or several hours.  The location of your baby's head as it moves through the birth canal is reported as a number called a station. If the baby's head has not started its descent, the station is described as being at minus 3 (-3). When your baby's head is at the zero station, it is at the middle of the birth canal and  is engaged in the pelvis. The station of your baby helps indicate the progress of the second stage of labor.  When your baby is born, your health care provider may hold the baby with his or her head lowered to prevent amniotic fluid, mucus, and blood from getting into the baby's lungs. The baby's mouth and nose may be suctioned with a small bulb syringe to remove any additional fluid.  Your health care provider may then place the baby on your stomach. It is important to keep the baby from getting cold. To do this, the health care provider will dry the baby off, place the baby directly on your skin (with no blankets between you and the baby), and cover the baby with warm, dry blankets.   The umbilical cord is cut. Third Stage During the third stage of labor, your health care provider will deliver the placenta (afterbirth) and make sure your bleeding is under control. The delivery of the placenta usually takes about 5 minutes but can take up to 30 minutes. After the placenta is delivered, a medicine may be given either by IV or injection to help contract the uterus and control bleeding. If you are planning to breastfeed, you can try to do so now. After you deliver the placenta, your uterus should contract and get very firm. If your uterus does not remain firm, your health care provider will massage it. This is important because the contraction of the uterus helps cut off bleeding at the site  where the placenta was attached to your uterus. If your uterus does not contract properly and stay firm, you may continue to bleed heavily. If there is a lot of bleeding, medicines may be given to contract the uterus and stop the bleeding.    This information is not intended to replace advice given to you by your health care provider. Make sure you discuss any questions you have with your health care provider.   Document Released: 10/10/2007 Document Revised: 01/21/2014 Document Reviewed: 08/28/2011 Elsevier Interactive Patient Education [employer]

## 2015-07-02 LAB — CULTURE, BETA STREP (GROUP B ONLY)

## 2015-07-03 LAB — URINE CYTOLOGY ANCILLARY ONLY
Chlamydia: NEGATIVE
Neisseria Gonorrhea: NEGATIVE

## 2015-07-04 ENCOUNTER — Inpatient Hospital Stay (HOSPITAL_COMMUNITY)
Admission: AD | Admit: 2015-07-04 | Discharge: 2015-07-08 | DRG: 774 | Disposition: A | Discharge status: Home / Self Care | Payer: 59 | Source: Ambulatory Visit | Attending: Family Medicine | Admitting: Family Medicine

## 2015-07-04 ENCOUNTER — Encounter (HOSPITAL_COMMUNITY): Payer: Self-pay | Admitting: *Deleted

## 2015-07-04 DIAGNOSIS — F525 Vaginismus not due to a substance or known physiological condition: Secondary | ICD-10-CM | POA: Diagnosis present

## 2015-07-04 DIAGNOSIS — O1400 Mild to moderate pre-eclampsia, unspecified trimester: Secondary | ICD-10-CM | POA: Diagnosis present

## 2015-07-04 DIAGNOSIS — D696 Thrombocytopenia, unspecified: Secondary | ICD-10-CM | POA: Diagnosis present

## 2015-07-04 DIAGNOSIS — D6959 Other secondary thrombocytopenia: Secondary | ICD-10-CM | POA: Diagnosis present

## 2015-07-04 DIAGNOSIS — O358XX0 Maternal care for other (suspected) fetal abnormality and damage, not applicable or unspecified: Secondary | ICD-10-CM | POA: Diagnosis present

## 2015-07-04 DIAGNOSIS — O9912 Other diseases of the blood and blood-forming organs and certain disorders involving the immune mechanism complicating childbirth: Secondary | ICD-10-CM | POA: Diagnosis present

## 2015-07-04 DIAGNOSIS — O99824 Streptococcus B carrier state complicating childbirth: Secondary | ICD-10-CM | POA: Diagnosis present

## 2015-07-04 DIAGNOSIS — O1413 Severe pre-eclampsia, third trimester: Secondary | ICD-10-CM | POA: Diagnosis not present

## 2015-07-04 DIAGNOSIS — IMO0002 Reserved for concepts with insufficient information to code with codable children: Secondary | ICD-10-CM

## 2015-07-04 DIAGNOSIS — O283 Abnormal ultrasonic finding on antenatal screening of mother: Secondary | ICD-10-CM

## 2015-07-04 DIAGNOSIS — O99820 Streptococcus B carrier state complicating pregnancy: Secondary | ICD-10-CM

## 2015-07-04 DIAGNOSIS — O1404 Mild to moderate pre-eclampsia, complicating childbirth: Secondary | ICD-10-CM | POA: Diagnosis present

## 2015-07-04 DIAGNOSIS — F411 Generalized anxiety disorder: Secondary | ICD-10-CM | POA: Diagnosis present

## 2015-07-04 DIAGNOSIS — Z3A38 38 weeks gestation of pregnancy: Secondary | ICD-10-CM

## 2015-07-04 DIAGNOSIS — O99344 Other mental disorders complicating childbirth: Secondary | ICD-10-CM | POA: Diagnosis present

## 2015-07-04 DIAGNOSIS — O1493 Unspecified pre-eclampsia, third trimester: Secondary | ICD-10-CM

## 2015-07-04 DIAGNOSIS — Z3490 Encounter for supervision of normal pregnancy, unspecified, unspecified trimester: Secondary | ICD-10-CM

## 2015-07-04 DIAGNOSIS — O1414 Severe pre-eclampsia complicating childbirth: Secondary | ICD-10-CM | POA: Diagnosis not present

## 2015-07-04 DIAGNOSIS — O99119 Other diseases of the blood and blood-forming organs and certain disorders involving the immune mechanism complicating pregnancy, unspecified trimester: Secondary | ICD-10-CM | POA: Diagnosis present

## 2015-07-04 LAB — COMPREHENSIVE METABOLIC PANEL
ALK PHOS: 168 U/L — AB (ref 38–126)
ALT: 15 U/L (ref 14–54)
AST: 24 U/L (ref 15–41)
Albumin: 2.9 g/dL — ABNORMAL LOW (ref 3.5–5.0)
Anion gap: 6 (ref 5–15)
BUN: 9 mg/dL (ref 6–20)
CALCIUM: 8.5 mg/dL — AB (ref 8.9–10.3)
CHLORIDE: 104 mmol/L (ref 101–111)
CO2: 24 mmol/L (ref 22–32)
CREATININE: 0.63 mg/dL (ref 0.44–1.00)
GFR calc non Af Amer: >60 mL/min (ref >60)
GLUCOSE: 99 mg/dL (ref 65–99)
Potassium: 3.4 mmol/L — ABNORMAL LOW (ref 3.5–5.1)
SODIUM: 134 mmol/L — AB (ref 135–145)
Total Bilirubin: 0.7 mg/dL (ref 0.3–1.2)
Total Protein: 6.1 g/dL — ABNORMAL LOW (ref 6.5–8.1)

## 2015-07-04 LAB — CBC WITH DIFFERENTIAL/PLATELET
Basophils Absolute: 0 10*3/uL (ref 0.0–0.1)
Basophils Relative: 0 %
EOS PCT: 1 %
Eosinophils Absolute: 0.1 10*3/uL (ref 0.0–0.7)
HCT: 34.5 % — ABNORMAL LOW (ref 36.0–46.0)
Hemoglobin: 12.1 g/dL (ref 12.0–15.0)
LYMPHS ABS: 2 10*3/uL (ref 0.7–4.0)
Lymphocytes Relative: 15 %
MCH: 31.4 pg (ref 26.0–34.0)
MCHC: 35.1 g/dL (ref 30.0–36.0)
MCV: 89.6 fL (ref 78.0–100.0)
MONOS PCT: 4 %
Monocytes Absolute: 0.6 10*3/uL (ref 0.1–1.0)
Neutro Abs: 10.9 10*3/uL — ABNORMAL HIGH (ref 1.7–7.7)
Neutrophils Relative %: 80 %
PLATELETS: 126 10*3/uL — AB (ref 150–400)
RBC: 3.85 MIL/uL — AB (ref 3.87–5.11)
RDW: 13.9 % (ref 11.5–15.5)
WBC: 13.5 10*3/uL — AB (ref 4.0–10.5)

## 2015-07-04 LAB — TYPE AND SCREEN
ABO/RH(D): O POS
ANTIBODY SCREEN: NEGATIVE

## 2015-07-04 LAB — URINALYSIS, ROUTINE W REFLEX MICROSCOPIC
Bilirubin Urine: NEGATIVE
GLUCOSE, UA: NEGATIVE mg/dL
Ketones, ur: 15 mg/dL — AB
Nitrite: NEGATIVE
PH: 6 (ref 5.0–8.0)
Protein, ur: NEGATIVE mg/dL

## 2015-07-04 LAB — PROTEIN / CREATININE RATIO, URINE
Creatinine, Urine: 23 mg/dL
Protein Creatinine Ratio: 0.48 mg/mg{Cre} — ABNORMAL HIGH (ref 0.00–0.15)
TOTAL PROTEIN, URINE: 11 mg/dL

## 2015-07-04 LAB — ABO/RH: ABO/RH(D): O POS

## 2015-07-04 LAB — URINE MICROSCOPIC-ADD ON

## 2015-07-04 MED ORDER — TERBUTALINE SULFATE 1 MG/ML IJ SOLN
0.2500 mg | Freq: Once | INTRAMUSCULAR | Status: DC | PRN
  Filled 2015-07-04: qty 1

## 2015-07-04 MED ORDER — OXYTOCIN 40 UNITS IN LACTATED RINGERS INFUSION - SIMPLE MED
2.5000 [IU]/h | INTRAVENOUS | Status: DC
  Filled 2015-07-04: qty 1000

## 2015-07-04 MED ORDER — LIDOCAINE HCL (PF) 1 % IJ SOLN
30.0000 mL | INTRAMUSCULAR | Status: DC | PRN
  Filled 2015-07-04: qty 30

## 2015-07-04 MED ORDER — LACTATED RINGERS IV SOLN
INTRAVENOUS | Status: DC
  Administered 2015-07-05 (×2): via INTRAVENOUS

## 2015-07-04 MED ORDER — FLEET ENEMA 7-19 GM/118ML RE ENEM: 1.0000 | ENEMA | RECTAL | Status: DC | PRN

## 2015-07-04 MED ORDER — ONDANSETRON HCL 4 MG/2ML IJ SOLN: 4.0000 mg | Freq: Four times a day (QID) | INTRAMUSCULAR | Status: DC | PRN

## 2015-07-04 MED ORDER — LACTATED RINGERS IV SOLN
500.0000 mL | INTRAVENOUS | Status: DC | PRN
  Administered 2015-07-04: 1000 mL via INTRAVENOUS

## 2015-07-04 MED ORDER — MISOPROSTOL 25 MCG QUARTER TABLET
25.0000 ug | ORAL_TABLET | ORAL | Status: DC | PRN
  Administered 2015-07-04: 25 ug via VAGINAL
  Filled 2015-07-04: qty 0.25

## 2015-07-04 MED ORDER — ACETAMINOPHEN 325 MG PO TABS: 650.0000 mg | ORAL_TABLET | ORAL | Status: DC | PRN

## 2015-07-04 MED ORDER — OXYCODONE-ACETAMINOPHEN 5-325 MG PO TABS: 1.0000 | ORAL_TABLET | ORAL | Status: DC | PRN

## 2015-07-04 MED ORDER — OXYTOCIN BOLUS FROM INFUSION
500.0000 mL | INTRAVENOUS | Status: DC
Start: 2015-07-04 — End: 2015-07-06

## 2015-07-04 MED ORDER — OXYCODONE-ACETAMINOPHEN 5-325 MG PO TABS: 2.0000 | ORAL_TABLET | ORAL | Status: DC | PRN

## 2015-07-04 MED ORDER — SOD CITRATE-CITRIC ACID 500-334 MG/5ML PO SOLN: 30.0000 mL | ORAL | Status: DC | PRN

## 2015-07-04 NOTE — [facility] Provider Note (Signed)
History     CSN: [id number]  Arrival date and time: 07/04/15 2006   First Provider Initiated Contact with Patient 07/04/15 2033      Chief Complaint  Patient presents with  . Hypertension   HPI Ms. Ariel Andrews is a 25 y.o. G1P0 at [redacted]w[redacted]d who presents to [facility] today with complaint of elevated blood pressure. The patient denies any history of HTN or elevated blood pressure in this pregnancy. She states that she has had LE edema consistently for many weeks, but decided tonight to take BP at home due to swelling. She states very slight headache rated at 1/10 now. She took Tylenol this morning for tooth pain, but otherwise no pain medication. She denies blurred vision, floaters, abdominal pain, vaginal bleeding, LOF or contractions. She reports good fetal movement. She denies complications with this pregnancy.   OB History    Gravida Para Term Preterm AB TAB SAB Ectopic Multiple Living   1               History reviewed. No pertinent past medical history.  Past Surgical History  Procedure Laterality Date  . Cyst excision      History reviewed. No pertinent family history.  Social History  Substance Use Topics  . Smoking status: Never Smoker   . Smokeless tobacco: None  . Alcohol Use: No    Allergies: No Known Allergies  Prescriptions prior to admission  Medication Sig Dispense Refill Last Dose  . acetaminophen (TYLENOL) 500 MG tablet Take 1,000 mg by mouth every 6 (six) hours as needed for moderate pain.   07/04/2015 at Unknown time  . docusate sodium (COLACE) 100 MG capsule TAKE 1 CAPSULE (100 MG TOTAL) BY MOUTH 2 (TWO) TIMES DAILY AS NEEDED. 30 capsule 2 07/03/2015 at Unknown time  . OVER THE COUNTER MEDICATION Take 2 tablets by mouth daily. rasberry leaf tablets daily   07/04/2015 at Unknown time  . Prenatal Vit-Fe Fumarate-FA (PRENATAL VITAMIN PLUS LOW IRON) 27-1 MG TABS Take 1 tablet by mouth daily. (Patient taking differently: Take 1 tablet by mouth daily. ) 30 tablet 11  07/03/2015 at Unknown time    Review of Systems  Constitutional: Negative for fever and malaise/fatigue.  Eyes: Negative for blurred vision.       Neg - flaoters  Cardiovascular: Positive for leg swelling.  Gastrointestinal: Negative for abdominal pain.  Neurological: Negative for headaches.   Physical Exam   Blood pressure 125/83, pulse 83, temperature 97.7 F (36.5 C), temperature source Oral, resp. rate 16, height 5\' 3"  (1.6 m), weight 177 lb 12.8 oz (80.65 kg), last menstrual period 10/05/2014, SpO2 100 %.  Physical Exam  Nursing note and vitals reviewed. Constitutional: She is oriented to person, place, and time. She appears well-developed and well-nourished. No distress.  HENT:  Head: Normocephalic and atraumatic.  Cardiovascular: Normal rate.   Respiratory: Effort normal.  GI: Soft. She exhibits no distension and no mass. There is no tenderness. There is no rebound and no guarding.  Musculoskeletal: She exhibits edema (1+ pitting edema to the mid shin bilaterally).  Neurological: She is alert and oriented to person, place, and time. She has normal reflexes.  No clonus  Skin: Skin is warm and dry. No erythema.  Psychiatric: She has a normal mood and affect.    Results for orders placed or performed during the hospital encounter of 07/04/15 (from the past 24 hour(s))  Urinalysis, Routine w reflex microscopic (not at [facility])     Status: Abnormal  Collection Time: 07/04/15  8:25 PM  Result Value Ref Range   Color, Urine YELLOW YELLOW   APPearance CLEAR CLEAR   Specific Gravity, Urine <1.005 (L) 1.005 - 1.030   pH 6.0 5.0 - 8.0   Glucose, UA NEGATIVE NEGATIVE mg/dL   Hgb urine dipstick TRACE (A) NEGATIVE   Bilirubin Urine NEGATIVE NEGATIVE   Ketones, ur 15 (A) NEGATIVE mg/dL   Protein, ur NEGATIVE NEGATIVE mg/dL   Nitrite NEGATIVE NEGATIVE   Leukocytes, UA MODERATE (A) NEGATIVE  Protein / creatinine ratio, urine     Status: Abnormal   Collection Time: 07/04/15  8:25 PM   Result Value Ref Range   Creatinine, Urine 23.00 mg/dL   Total Protein, Urine 11 mg/dL   Protein Creatinine Ratio 0.48 (H) 0.00 - 0.15 mg/mg[Cre]  Urine microscopic-add on     Status: Abnormal   Collection Time: 07/04/15  8:25 PM  Result Value Ref Range   Squamous Epithelial / LPF 6-30 (A) NONE SEEN   WBC, UA 6-30 0 - 5 WBC/hpf   RBC / HPF 0-5 0 - 5 RBC/hpf   Bacteria, UA RARE (A) NONE SEEN  CBC with Differential/Platelet     Status: Abnormal   Collection Time: 07/04/15  8:37 PM  Result Value Ref Range   WBC 13.5 (H) 4.0 - 10.5 K/uL   RBC 3.85 (L) 3.87 - 5.11 MIL/uL   Hemoglobin 12.1 12.0 - 15.0 g/dL   HCT 16.1 (L) 09.6 - 04.5 %   MCV 89.6 78.0 - 100.0 fL   MCH 31.4 26.0 - 34.0 pg   MCHC 35.1 30.0 - 36.0 g/dL   RDW 40.9 81.1 - 91.4 %   Platelets 126 (L) 150 - 400 K/uL   Neutrophils Relative % 80 %   Neutro Abs 10.9 (H) 1.7 - 7.7 K/uL   Lymphocytes Relative 15 %   Lymphs Abs 2.0 0.7 - 4.0 K/uL   Monocytes Relative 4 %   Monocytes Absolute 0.6 0.1 - 1.0 K/uL   Eosinophils Relative 1 %   Eosinophils Absolute 0.1 0.0 - 0.7 K/uL   Basophils Relative 0 %   Basophils Absolute 0.0 0.0 - 0.1 K/uL  Comprehensive metabolic panel     Status: Abnormal   Collection Time: 07/04/15  8:37 PM  Result Value Ref Range   Sodium 134 (L) 135 - 145 mmol/L   Potassium 3.4 (L) 3.5 - 5.1 mmol/L   Chloride 104 101 - 111 mmol/L   CO2 24 22 - 32 mmol/L   Glucose, Bld 99 65 - 99 mg/dL   BUN 9 6 - 20 mg/dL   Creatinine, Ser 7.82 0.44 - 1.00 mg/dL   Calcium 8.5 (L) 8.9 - 10.3 mg/dL   Total Protein 6.1 (L) 6.5 - 8.1 g/dL   Albumin 2.9 (L) 3.5 - 5.0 g/dL   AST 24 15 - 41 U/L   ALT 15 14 - 54 U/L   Alkaline Phosphatase 168 (H) 38 - 126 U/L   Total Bilirubin 0.7 0.3 - 1.2 mg/dL   GFR calc non Af Amer >60 >60 mL/min   GFR calc Af Amer >60 >60 mL/min   Anion gap 6 5 - 15   Patient Vitals for the past 24 hrs:  BP Temp Temp src Pulse Resp SpO2 Height Weight  07/04/15 2130 125/83 mmHg - - 83 - - - -   07/04/15 2115 127/81 mmHg - - 81 - - - -  07/04/15 2100 129/83 mmHg - - 82 - - - -  07/04/15 2055 138/87 mmHg - - 97 - - - -  07/04/15 2022 150/84 mmHg 97.7 F (36.5 C) Oral 91 16 100 % 5\' 3"  (1.6 m) 177 lb 12.8 oz (80.65 kg)    Fetal Monitoring: Baseline: 115 bpm Variability: moderate Accelerations: 15 x 15 Decelerations: none Contractions: few, irregular  [facility] Course  Procedures None  MDM UA, CBC, CMP, urine protein/creatinine ratio Serial BPs Discussed patient, VS and labs with Dr. Shawnie PonsPratt. Admit for induction.  Assessment and Plan  A: SIUP at [id number] Pre-eclampsia in third trimester  P: Admit for induction.   Marny LowensteinJulie N Wenzel, PA-C  07/04/2015, 9:55 PM

## 2015-07-04 NOTE — H&P (Signed)
LABOR AND DELIVERY ADMISSION HISTORY AND PHYSICAL NOTE  Ariel Andrews is a 25 y.o. female G1P0 with IUP at [redacted]w[redacted]d by LMP presenting for pre-eclampsia without severe features. Pt has had prenatal scripts throughout pregnancy and has checked BP regularly.  Today incidentally checked BP and did a weight check found to be elevated.  Called clinic and told to come in for induction.  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 She reports positive fetal movement. She denies leakage of fluid or vaginal bleeding.  Prenatal History/Complications:  Past Medical History: History reviewed. No pertinent past medical history.  Past Surgical History: Past Surgical History  Procedure Laterality Date  . Cyst excision      Obstetrical History: OB History    Gravida Para Term Preterm AB TAB SAB Ectopic Multiple Living   1               Social History: Social History   Social History  . Marital Status: Single    Spouse Name: N/A  . Number of Children: N/A  . Years of Education: N/A   Social History Main Topics  . Smoking status: Never Smoker   . Smokeless tobacco: None  . Alcohol Use: No  . Drug Use: No  . Sexual Activity: Yes   Other Topics Concern  . None   Social History Narrative    Family History: History reviewed. No pertinent family history.  Allergies: No Known Allergies  Prescriptions prior to admission  Medication Sig Dispense Refill Last Dose  . acetaminophen (TYLENOL) 500 MG tablet Take 1,000 mg by mouth every 6 (six) hours as needed for moderate pain.   07/04/2015 at Unknown time  . docusate sodium (COLACE) 100 MG capsule TAKE 1 CAPSULE (100 MG TOTAL) BY MOUTH 2 (TWO) TIMES DAILY AS NEEDED. 30 capsule 2 07/03/2015 at Unknown time  . OVER THE COUNTER MEDICATION Take 2 tablets by mouth daily. rasberry leaf tablets daily   07/04/2015 at Unknown time  . Prenatal Vit-Fe Fumarate-FA (PRENATAL VITAMIN PLUS  LOW IRON) 27-1 MG TABS Take 1 tablet by mouth daily. (Patient taking differently: Take 1 tablet by mouth daily. ) 30 tablet 11 07/03/2015 at Unknown time     Review of Systems   All systems reviewed and negative except as stated in HPI  Blood pressure 133/83, pulse 79, temperature 97.9 F (36.6 C), temperature source Oral, resp. rate 20, height  (1.6 m), weight 177 lb 12.8 oz (80.65 kg), last menstrual period 10/05/2014, SpO2 100 %. General appearance: alert and cooperative Lungs: clear to auscultation bilaterally Heart: regular rate and rhythm Abdomen: soft, non-tender; bowel sounds normal Extremities: No calf swelling or tenderness Presentation: cephalic (confirmed with ultrasound) Fetal monitoring: Baseline 130, mod variability, +acels, no decels Uterine activity: Ctx q2-51min Dilation: Closed Effacement (%): Thick Exam by:: Raliegh Ip RN   Prenatal labs: ABO, Rh: --/--/O POS, O POS (06/20 2038) Antibody: NEG (06/20 2038) Rubella: !Error! RPR: NON REAC (04/21 0912)  HBsAg: NEGATIVE (11/29 1542)  HIV: NONREACTIVE (04/21 0912)  GBS: Positive (06/16 0000)  1 hr Glucola: 135 Genetic screening:  Declined Anatomy US: 18 wks; L choroid plexus and echogenic focus > genetic counseling & f/u US-->EFLV still present and new left pyelectasis > f/u on 05/11/15> pyelectasis resolved, EF still present  Prenatal Transfer Tool  Maternal Diabetes: No Genetic Screening: Declined Maternal Ultrasounds/Referrals: Abnormal:  Findings:   Isolated EIF (echogenic intracardiac focus), Isolated choroid plexus cyst Fetal Ultrasounds or other Referrals:  None Maternal Substance Abuse:  No Significant Maternal Medications:  None Significant Maternal Lab Results: None  Results for orders placed or performed during the hospital encounter of 07/04/15 (from the past 24 hour(s))  Urinalysis, Routine w reflex microscopic (not at [facility])   Collection Time: 07/04/15  8:25 PM  Result Value Ref Range    Color, Urine YELLOW YELLOW   APPearance CLEAR CLEAR   Specific Gravity, Urine <1.005 (L) 1.005 - 1.030   pH 6.0 5.0 - 8.0   Glucose, UA NEGATIVE NEGATIVE mg/dL   Hgb urine dipstick TRACE (A) NEGATIVE   Bilirubin Urine NEGATIVE NEGATIVE   Ketones, ur 15 (A) NEGATIVE mg/dL   Protein, ur NEGATIVE NEGATIVE mg/dL   Nitrite NEGATIVE NEGATIVE   Leukocytes, UA MODERATE (A) NEGATIVE  Protein / creatinine ratio, urine   Collection Time: 07/04/15  8:25 PM  Result Value Ref Range   Creatinine, Urine 23.00 mg/dL   Total Protein, Urine 11 mg/dL   Protein Creatinine Ratio 0.48 (H) 0.00 - 0.15 mg/mg[Cre]  Urine microscopic-add on   Collection Time: 07/04/15  8:25 PM  Result Value Ref Range   Squamous Epithelial / LPF 6-30 (A) NONE SEEN   WBC, UA 6-30 0 - 5 WBC/hpf   RBC / HPF 0-5 0 - 5 RBC/hpf   Bacteria, UA RARE (A) NONE SEEN  CBC with Differential/Platelet   Collection Time: 07/04/15  8:37 PM  Result Value Ref Range   WBC 13.5 (H) 4.0 - 10.5 K/uL   RBC 3.85 (L) 3.87 - 5.11 MIL/uL   Hemoglobin 12.1 12.0 - 15.0 g/dL   HCT 16.1 (L) 09.6 - 04.5 %   MCV 89.6 78.0 - 100.0 fL   MCH 31.4 26.0 - 34.0 pg   MCHC 35.1 30.0 - 36.0 g/dL   RDW 40.9 81.1 - 91.4 %   Platelets 126 (L) 150 - 400 K/uL   Neutrophils Relative % 80 %   Neutro Abs 10.9 (H) 1.7 - 7.7 K/uL   Lymphocytes Relative 15 %   Lymphs Abs 2.0 0.7 - 4.0 K/uL   Monocytes Relative 4 %   Monocytes Absolute 0.6 0.1 - 1.0 K/uL   Eosinophils Relative 1 %   Eosinophils Absolute 0.1 0.0 - 0.7 K/uL   Basophils Relative 0 %   Basophils Absolute 0.0 0.0 - 0.1 K/uL  Comprehensive metabolic panel   Collection Time: 07/04/15  8:37 PM  Result Value Ref Range   Sodium 134 (L) 135 - 145 mmol/L   Potassium 3.4 (L) 3.5 - 5.1 mmol/L   Chloride 104 101 - 111 mmol/L   CO2 24 22 - 32 mmol/L   Glucose, Bld 99 65 - 99 mg/dL   BUN 9 6 - 20 mg/dL   Creatinine, Ser 7.82 0.44 - 1.00 mg/dL   Calcium 8.5 (L) 8.9 - 10.3 mg/dL   Total Protein 6.1 (L) 6.5  - 8.1 g/dL   Albumin 2.9 (L) 3.5 - 5.0 g/dL   AST 24 15 - 41 U/L   ALT 15 14 - 54 U/L   Alkaline Phosphatase 168 (H) 38 - 126 U/L   Total Bilirubin 0.7 0.3 - 1.2 mg/dL   GFR calc non Af Amer >60 >60 mL/min   GFR calc Af Amer >60 >60 mL/min   Anion gap 6 5 - 15  Type and screen [facility] [place]   Collection Time: 07/04/15  8:38 PM  Result Value Ref Range   ABO/RH(D) O POS    Antibody Screen NEG  Sample Expiration 07/07/2015   ABO/Rh   Collection Time: 07/04/15  8:38 PM  Result Value Ref Range   ABO/RH(D) O POS     Patient Active Problem List   Diagnosis Date Noted  . Mild preeclampsia 07/04/2015  . Thrombocytopenia affecting pregnancy (HCC) 05/06/2015  . Fetal pyelectasis 04/24/2015  . Choroid plexus cyst of fetus 02/16/2015  . Echogenic intracardiac focus of fetus on prenatal ultrasound 02/16/2015  . Abnormal fetal ultrasound 02/15/2015  . 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 Supervision of normal pregnancy, antepartum 12/13/2014  . Anxiety state 11/09/2014    Assessment: Virgel ManifoldJessica Laffoon is a 25 y.o. G1P0 at 844w6d here for IOL for pre-eclampsia without severe features  #Labor:Latent, cytotec now, consider FB if next cervical check amenable #Pain: Fentanyl, NO, considering epidural #FWB: Cat-I #ID:  GBS positive - pcn #MOF: Breast #MOC:Condoms #Circ:  Female, not necessary # gestational thrombocytopenia: plts 120s, will monitor  Olena LeatherwoodKelly M Aguilar 07/04/2015, 11:55 PM   OB FELLOW HISTORY AND PHYSICAL ATTESTATION  I have seen and examined this patient; I agree with above documentation in the resident's note.    Cherrie Gauzeoah B Shana Zavaleta 07/05/2015, 12:43 AM

## 2015-07-04 NOTE — MAU Note (Signed)
Pt. Notes she took her BP at home and it was elevated. Was told to come in to be evaluated. Next OB appointment with a midwife will be Friday. Denies history of elevated BP. Baby is moving well. Denies LOF or bleeding. Denies new abnormal discharge. Here for evaluation.

## 2015-07-04 NOTE — MAU Note (Signed)
Report given to Cent[employer] Corporationleta Charge RN. Pt. To room 169 via wheelchair at this time.

## 2015-07-04 NOTE — MAU Note (Signed)
Pt states that she checked her BP at home-was 131/96. Rechecked 10 mins later and 129/100. Denies HA, vision changes. or RUQ pain. Denies pain. Some braxton hicks contractions. Denies vag bleeding or discharge. +FM.

## 2015-07-05 ENCOUNTER — Inpatient Hospital Stay (HOSPITAL_COMMUNITY): Payer: 59 | Admitting: Anesthesiology

## 2015-07-05 DIAGNOSIS — O99820 Streptococcus B carrier state complicating pregnancy: Secondary | ICD-10-CM

## 2015-07-05 LAB — CBC
HCT: 36.5 % (ref 36.0–46.0)
HEMOGLOBIN: 12.6 g/dL (ref 12.0–15.0)
MCH: 31 pg (ref 26.0–34.0)
MCHC: 34.5 g/dL (ref 30.0–36.0)
MCV: 89.7 fL (ref 78.0–100.0)
PLATELETS: 119 10*3/uL — AB (ref 150–400)
RBC: 4.07 MIL/uL (ref 3.87–5.11)
RDW: 13.8 % (ref 11.5–15.5)
WBC: 14.7 10*3/uL — ABNORMAL HIGH (ref 4.0–10.5)

## 2015-07-05 LAB — RPR: RPR Ser Ql: NONREACTIVE

## 2015-07-05 MED ORDER — MISOPROSTOL 50MCG HALF TABLET: 50.0000 ug | ORAL_TABLET | ORAL | Status: DC

## 2015-07-05 MED ORDER — TERBUTALINE SULFATE 1 MG/ML IJ SOLN
0.2500 mg | Freq: Once | INTRAMUSCULAR | Status: DC | PRN
  Filled 2015-07-05: qty 1

## 2015-07-05 MED ORDER — LACTATED RINGERS IV SOLN
500.0000 mL | Freq: Once | INTRAVENOUS | Status: AC
  Administered 2015-07-05: 500 mL via INTRAVENOUS

## 2015-07-05 MED ORDER — OXYTOCIN 40 UNITS IN LACTATED RINGERS INFUSION - SIMPLE MED
1.0000 m[IU]/min | INTRAVENOUS | Status: DC
  Administered 2015-07-05: 2 m[IU]/min via INTRAVENOUS

## 2015-07-05 MED ORDER — PHENYLEPHRINE 40 MCG/ML (10ML) SYRINGE FOR IV PUSH (FOR BLOOD PRESSURE SUPPORT)
80.0000 ug | PREFILLED_SYRINGE | INTRAVENOUS | Status: DC | PRN
  Filled 2015-07-05: qty 5

## 2015-07-05 MED ORDER — FENTANYL 2.5 MCG/ML BUPIVACAINE 1/10 % EPIDURAL INFUSION (WH - ANES): 14.0000 mL/h | INTRAMUSCULAR | Status: DC | PRN

## 2015-07-05 MED ORDER — EPHEDRINE 5 MG/ML INJ
10.0000 mg | INTRAVENOUS | Status: DC | PRN
  Filled 2015-07-05: qty 2

## 2015-07-05 MED ORDER — PENICILLIN G POTASSIUM 5000000 UNITS IJ SOLR
5.0000 10*6.[IU] | Freq: Once | INTRAVENOUS | Status: AC
  Administered 2015-07-05: 5 10*6.[IU] via INTRAVENOUS
  Filled 2015-07-05: qty 5

## 2015-07-05 MED ORDER — FENTANYL 2.5 MCG/ML BUPIVACAINE 1/10 % EPIDURAL INFUSION (WH - ANES)
14.0000 mL/h | INTRAMUSCULAR | Status: DC | PRN
  Administered 2015-07-05: 14 mL/h via EPIDURAL
  Filled 2015-07-05: qty 125

## 2015-07-05 MED ORDER — PHENYLEPHRINE 40 MCG/ML (10ML) SYRINGE FOR IV PUSH (FOR BLOOD PRESSURE SUPPORT)
80.0000 ug | PREFILLED_SYRINGE | INTRAVENOUS | Status: DC | PRN
  Filled 2015-07-05: qty 10
  Filled 2015-07-05: qty 5

## 2015-07-05 MED ORDER — FENTANYL CITRATE (PF) 100 MCG/2ML IJ SOLN
INTRAMUSCULAR | Status: AC
  Filled 2015-07-05: qty 2

## 2015-07-05 MED ORDER — MISOPROSTOL 50MCG HALF TABLET
50.0000 ug | ORAL_TABLET | ORAL | Status: DC
  Administered 2015-07-05 (×2): 50 ug via ORAL
  Filled 2015-07-05 (×2): qty 0.5

## 2015-07-05 MED ORDER — DEXTROSE 5 % IV SOLN
2.5000 10*6.[IU] | INTRAVENOUS | Status: DC
  Administered 2015-07-05 – 2015-07-06 (×6): 2.5 10*6.[IU] via INTRAVENOUS
  Filled 2015-07-05 (×9): qty 2.5

## 2015-07-05 MED ORDER — DIPHENHYDRAMINE HCL 50 MG/ML IJ SOLN: 12.5000 mg | INTRAMUSCULAR | Status: DC | PRN

## 2015-07-05 MED ORDER — FENTANYL CITRATE (PF) 100 MCG/2ML IJ SOLN
100.0000 ug | INTRAMUSCULAR | Status: DC | PRN
  Administered 2015-07-05: 100 ug via INTRAVENOUS

## 2015-07-05 MED ORDER — LIDOCAINE HCL (PF) 1 % IJ SOLN
INTRAMUSCULAR | Status: DC | PRN
  Administered 2015-07-05 (×2): 4 mL

## 2015-07-05 NOTE — Anesthesia Procedure Notes (Signed)
Epidural Patient location during procedure: OB  Staffing Anesthesiologist: Manaia Samad Performed by: anesthesiologist   Preanesthetic Checklist Completed: patient identified, pre-op evaluation, timeout performed, IV checked, risks and benefits discussed and monitors and equipment checked  Epidural Patient position: sitting Prep: site prepped and draped and DuraPrep Patient monitoring: heart rate Approach: midline Location: L3-L4 Injection technique: LOR air and LOR saline  Needle:  Needle type: Tuohy  Needle gauge: 17 G Needle length: 9 cm Needle insertion depth: 6 cm Catheter type: closed end flexible Catheter size: 19 Gauge Catheter at skin depth: 12 cm Test dose: negative  Assessment Sensory level: T8 Events: blood not aspirated, injection not painful, no injection resistance, negative IV test and no paresthesia  Additional Notes Reason for block:procedure for pain   

## 2015-07-05 NOTE — Progress Notes (Signed)
Labor Progress Note Ariel ManifoldJessica Andrews is a 25 y.o. G1P0 at 8340w0d presented for IOL for ?Preeclampsia S: reports some contraction. Thinks she lost her mucus plug. She reports some blood on toilet paper when she wipes after bathroom. Denies headache, vision change or RUQ pain.   O:  BP 127/71 mmHg  Pulse 62  Temp(Src) 98.1 F (36.7 C) (Oral)  Resp 16  Ht 5\' 3"  (1.6 m)  Wt 177 lb 12.8 oz (80.65 kg)  BMI 31.50 kg/m2  SpO2 100%  LMP 10/05/2014 (Approximate)   Reflex: Patellar reflex 2+ bilterally EFM: 130/mod var/+accels/ no decels  CVE: Dilation: Closed Effacement (%): Thick Presentation: Vertex (confirmed by bedside U/S done by Dr. Richardo HanksAquilar) Exam by:: Raliegh Ipatherine Stout RN   A&P: 25 y.o. G1P0 6940w0d admitted for IOL for ?preeclampsia. Unsure about her diagnosis as patient had only one mild range BP per her chart. She has UPC of 0.48. No severe features.  #Labor: no recent cervical check due to vaginismus. Received second Cytotec at 10 am. Feels more contractions. #Pain: as needed. Will offer epidural. #FWB: CAT-1 #GBS: positive. Getting PCN  Almon Herculesaye T Aniyia Rane, MD 11:40 AM

## 2015-07-05 NOTE — Anesthesia Pain Management Evaluation Note (Signed)
  CRNA Pain Management Visit Note  Patient: Ariel ManifoldJessica Brouhard, 25 y.o., female  "Hello I am a member of the anesthesia team at [facility]. We have an anesthesia team available at all times to provide care throughout the hospital, including epidural management and anesthesia for C-section. I don't know your plan for the delivery whether it a natural birth, water birth, IV sedation, nitrous supplementation, doula or epidural, but we want to meet your pain goals."   1.Was your pain managed to your expectations on prior hospitalizations?   No prior hospitalizations  2.What is your expectation for pain management during this hospitalization?     Epidural and IV pain meds  3.How can we help you reach that goal?  By explaining options for pain control  Record the patient's initial score and the patient's pain goal.   Pain: 3  Pain Goal: 8 [facility] wants you to be able to say your pain was always managed very well.  Onis Markoff 07/05/2015

## 2015-07-05 NOTE — Progress Notes (Signed)
Labor Progress Note [name] is a 25 y.o. G1P0 at 3038w0d presented for IOL preeclampsia without severe features  S: Feeling well. Reports menstrual cramping. Patient has severe vaginismus and required 30 minutes to check her cervix. She needed constant guidance through the exam and desired control over the speed and depth of penetration. She was given fentanyl about 20 minutes into the exam due to inability to tolerate the exam.   O:  BP 128/81 mmHg  Pulse 79  Temp(Src) 98.1 F (36.7 C) (Oral)  Resp 18  Ht 5\' 3"  (1.6 m)  Wt 177 lb 12.8 oz (80.65 kg)  BMI 31.50 kg/m2  SpO2 100%  LMP 10/05/2014 (Approximate) EFM: 130/mod/+accels, no decels  CVE: Dilation: 4 Effacement (%): 50 Station: -1 Presentation: Vertex Exam by:: Dr Alvester MorinNewton  Swept membranes   A&P: 25 y.o. G1P0 4838w0d IOl for preeclampsia without severe features #Labor: Progressing well. Start pit. #Pain:  Recommend premedicating with fentanyl prior to exams if possible. After the difficult exam I discussed epidural placement to help with comfort. She reports she is "trying to go natural." I provided support and discussed possible use of nitrous as there is a significant anxiety component to there pain experience #FWB: Cat I #GBS positive  Ariel FlakeKimberly Niles Arjan Strohm, MD 3:40 PM

## 2015-07-05 NOTE — Progress Notes (Signed)
Labor Progress Note Virgel ManifoldJessica Westby is a 25 y.o. G1P0 at 8778w0d presented for IOL for ?Preeclampsia S: reports contraction. Still wants to wait on epidural. Denies headache, vision change or RUQ pain.   O:  BP 136/70 mmHg  Pulse 60  Temp(Src) 98.3 F (36.8 C) (Oral)  Resp 18  Ht 5\' 3"  (1.6 m)  Wt 177 lb 12.8 oz (80.65 kg)  BMI 31.50 kg/m2  SpO2 100%  LMP 10/05/2014 (Approximate)   EFM: 130/mod var/+accels/ no decels  CVE: Dilation: 4 Effacement (%): 50 Station: -1 Presentation: Vertex Exam by:: Dr Alvester MorinNewton   A&P: 25 y.o. G1P0 1878w0d admitted for IOL for ?preeclampsia.  #?Preeclampsia: unsure about her diagnosis as patient had only one mild range BP per her chart. She has UPC of 0.48. No severe features.  #Labor: progressing on pitocin. #Pain: fentanyl as needed. May need epidural #FWB: CAT-1 #GBS: positive. Getting PCN  Almon Herculesaye T Georg Ang, MD 7:41 PM

## 2015-07-05 NOTE — Anesthesia Preprocedure Evaluation (Signed)
Anesthesia Evaluation  Patient identified by MRN, date of birth, ID band Patient awake    Reviewed: Allergy & Precautions, NPO status , Patient's Chart, lab work & pertinent test results  Airway Mallampati: II  TM Distance: >3 FB Neck ROM: Full    Dental no notable dental hx.    Pulmonary neg pulmonary ROS,    Pulmonary exam normal breath sounds clear to auscultation       Cardiovascular hypertension, Normal cardiovascular exam Rhythm:Regular Rate:Normal     Neuro/Psych PSYCHIATRIC DISORDERS Anxiety negative neurological ROS     GI/Hepatic negative GI ROS, Neg liver ROS,   Endo/Other  negative endocrine ROS  Renal/GU Renal diseasenegative Renal ROS     Musculoskeletal negative musculoskeletal ROS (+)   Abdominal   Peds  Hematology negative hematology ROS (+)   Anesthesia Other Findings   Reproductive/Obstetrics (+) Pregnancy                             Anesthesia Physical Anesthesia Plan  ASA: II  Anesthesia Plan: Epidural   Post-op Pain Management:    Induction:   Airway Management Planned:   Additional Equipment:   Intra-op Plan:   Post-operative Plan:   Informed Consent: I have reviewed the patients History and Physical, chart, labs and discussed the procedure including the risks, benefits and alternatives for the proposed anesthesia with the patient or authorized representative who has indicated his/her understanding and acceptance.     Plan Discussed with:   Anesthesia Plan Comments:         Anesthesia Quick Evaluation

## 2015-07-06 ENCOUNTER — Encounter (HOSPITAL_COMMUNITY): Payer: Self-pay

## 2015-07-06 DIAGNOSIS — Z3A38 38 weeks gestation of pregnancy: Secondary | ICD-10-CM

## 2015-07-06 DIAGNOSIS — O99824 Streptococcus B carrier state complicating childbirth: Secondary | ICD-10-CM

## 2015-07-06 DIAGNOSIS — O1414 Severe pre-eclampsia complicating childbirth: Secondary | ICD-10-CM

## 2015-07-06 LAB — CBC
HEMATOCRIT: 31.2 % — AB (ref 36.0–46.0)
HEMOGLOBIN: 10.9 g/dL — AB (ref 12.0–15.0)
MCH: 31.3 pg (ref 26.0–34.0)
MCHC: 34.9 g/dL (ref 30.0–36.0)
MCV: 89.7 fL (ref 78.0–100.0)
Platelets: 117 10*3/uL — ABNORMAL LOW (ref 150–400)
RBC: 3.48 MIL/uL — ABNORMAL LOW (ref 3.87–5.11)
RDW: 13.8 % (ref 11.5–15.5)
WBC: 19.9 10*3/uL — ABNORMAL HIGH (ref 4.0–10.5)

## 2015-07-06 MED ORDER — SIMETHICONE 80 MG PO CHEW: 80.0000 mg | CHEWABLE_TABLET | ORAL | Status: DC | PRN

## 2015-07-06 MED ORDER — IBUPROFEN 600 MG PO TABS
600.0000 mg | ORAL_TABLET | Freq: Four times a day (QID) | ORAL | Status: DC
  Administered 2015-07-06 – 2015-07-07 (×5): 600 mg via ORAL
  Filled 2015-07-06 (×6): qty 1

## 2015-07-06 MED ORDER — METHYLERGONOVINE MALEATE 0.2 MG PO TABS: 0.2000 mg | ORAL_TABLET | ORAL | Status: DC

## 2015-07-06 MED ORDER — TETANUS-DIPHTH-ACELL PERTUSSIS 5-2.5-18.5 LF-MCG/0.5 IM SUSP: 0.5000 mL | Freq: Once | INTRAMUSCULAR | Status: DC

## 2015-07-06 MED ORDER — COCONUT OIL OIL
1.0000 "application " | TOPICAL_OIL | Status: DC | PRN
  Filled 2015-07-06: qty 120

## 2015-07-06 MED ORDER — BENZOCAINE-MENTHOL 20-0.5 % EX AERO
1.0000 "application " | INHALATION_SPRAY | CUTANEOUS | Status: DC | PRN
  Administered 2015-07-06: 1 via TOPICAL
  Filled 2015-07-06: qty 56

## 2015-07-06 MED ORDER — ZOLPIDEM TARTRATE 5 MG PO TABS: 5.0000 mg | ORAL_TABLET | Freq: Every evening | ORAL | Status: DC | PRN

## 2015-07-06 MED ORDER — DIBUCAINE 1 % RE OINT: 1.0000 "application " | TOPICAL_OINTMENT | RECTAL | Status: DC | PRN

## 2015-07-06 MED ORDER — SENNOSIDES-DOCUSATE SODIUM 8.6-50 MG PO TABS
2.0000 | ORAL_TABLET | ORAL | Status: DC
  Administered 2015-07-07 – 2015-07-08 (×2): 2 via ORAL
  Filled 2015-07-06 (×2): qty 2

## 2015-07-06 MED ORDER — DIPHENHYDRAMINE HCL 25 MG PO CAPS: 25.0000 mg | ORAL_CAPSULE | Freq: Four times a day (QID) | ORAL | Status: DC | PRN

## 2015-07-06 MED ORDER — ACETAMINOPHEN 325 MG PO TABS
650.0000 mg | ORAL_TABLET | ORAL | Status: DC | PRN
  Administered 2015-07-07 – 2015-07-08 (×4): 650 mg via ORAL
  Filled 2015-07-06 (×4): qty 2

## 2015-07-06 MED ORDER — MISOPROSTOL 200 MCG PO TABS
ORAL_TABLET | ORAL | Status: AC
  Filled 2015-07-06: qty 4

## 2015-07-06 MED ORDER — WITCH HAZEL-GLYCERIN EX PADS: 1.0000 "application " | MEDICATED_PAD | CUTANEOUS | Status: DC | PRN

## 2015-07-06 MED ORDER — METHYLERGONOVINE MALEATE 0.2 MG/ML IJ SOLN: 0.2000 mg | INTRAMUSCULAR | Status: DC

## 2015-07-06 MED ORDER — ONDANSETRON HCL 4 MG PO TABS: 4.0000 mg | ORAL_TABLET | ORAL | Status: DC | PRN

## 2015-07-06 MED ORDER — PRENATAL MULTIVITAMIN CH
1.0000 | ORAL_TABLET | Freq: Every day | ORAL | Status: DC
  Administered 2015-07-06 – 2015-07-08 (×3): 1 via ORAL
  Filled 2015-07-06 (×3): qty 1

## 2015-07-06 MED ORDER — MISOPROSTOL 200 MCG PO TABS
800.0000 ug | ORAL_TABLET | Freq: Once | ORAL | Status: AC
  Administered 2015-07-06: 800 ug via ORAL

## 2015-07-06 MED ORDER — ONDANSETRON HCL 4 MG/2ML IJ SOLN: 4.0000 mg | INTRAMUSCULAR | Status: DC | PRN

## 2015-07-06 NOTE — Progress Notes (Signed)
Patient ID: Virgel ManifoldJessica Adeyemi, female   DOB: 11/18/1990, 25 y.o.   MRN: [id number]  Feeling some pressure rectally VSS, afeb FHR 150s, +accels, no decels Ctx difficult to trace  Cx C/C/+2/+3; attempted to AROM but not membrane felt  IUP@term  End 1st stage  Will begin pushing  Cam HaiSHAW, KIMBERLY CNM 07/06/2015 2:48 AM

## 2015-07-06 NOTE — Progress Notes (Signed)
Patient ID: Ariel ManifoldJessica Offield, female   DOB: 10/29/1990, 25 y.o.   MRN: [id number]  Mostly comfortable on left side other than shaking  VSS, afeb FHR 130s, +accels, no decels Ctx q 3 mins w/ Pit @ 6010mu/min Cx at last exam: complete/+2  End 1st stage  Allow to labor down; hope for pt to have urge to push within 1-2 hrs  Meliss Fleek CNM 07/06/2015 1:04 AM

## 2015-07-06 NOTE — Lactation Note (Signed)
This note was copied from a baby's chart. Lactation Consultation Note; Initial visit with mom, Baby now 8 hours old. Mom reports she has latched only for a few minutes and is very sleepy. Baby asleep on mom's chest. Reviewed feeding cues and encouraged to feed whenever she sees them, Several visitors in. BF brochure given with resources for support after DC. No questions at present. To call for assist prn Patient Name: Ariel [name] ZOXWR'UToday's Date: 07/06/2015 Reason for consult: Initial assessment   Maternal Data Formula Feeding for Exclusion: No Has patient been taught Hand Expression?: Yes (per mom RN taught her and she has seen some Colostrum) Does the patient have breastfeeding experience prior to this delivery?: No  Feeding Feeding Type: Breast Fed Length of feed: 0 min  LATCH Score/Interventions Latch: Too sleepy or reluctant, no latch achieved, no sucking elicited.                    Lactation Tools Discussed/Used     Consult Status Consult Status: Follow-up Date: 07/07/15 Follow-up type: In-patient    Pamelia HoitWeeks, Anaelle Dunton D 07/06/2015, 12:17 PM

## 2015-07-06 NOTE — Anesthesia Postprocedure Evaluation (Signed)
Anesthesia Post Note  Patient: Ariel Andrews  Procedure(s) Performed: * No procedures listed *  Patient location during evaluation: Mother Baby Anesthesia Type: Epidural Level of consciousness: oriented and awake and alert Pain management: pain level controlled Vital Signs Assessment: post-procedure vital signs reviewed and stable Respiratory status: spontaneous breathing and nonlabored ventilation Cardiovascular status: stable Postop Assessment: epidural receding, patient able to bend at knees, no signs of nausea or vomiting and adequate PO intake Anesthetic complications: no     Last Vitals:  Filed Vitals:   07/06/15 0635 07/06/15 0735  BP: 127/65 117/54  Pulse: 79 86  Temp: 37.1 C 37.1 C  Resp: 20 18    Last Pain:  Filed Vitals:   07/06/15 1244  PainSc: 0-No pain   Pain Goal: Patients Stated Pain Goal: 0 (07/06/15 0635)               Laban EmperorMalinova,Conley Pawling Hristova

## 2015-07-07 ENCOUNTER — Encounter: Payer: 59 | Admitting: Family

## 2015-07-07 LAB — CBC
HEMATOCRIT: 25.5 % — AB (ref 36.0–46.0)
HEMOGLOBIN: 8.9 g/dL — AB (ref 12.0–15.0)
MCH: 31.2 pg (ref 26.0–34.0)
MCHC: 34.9 g/dL (ref 30.0–36.0)
MCV: 89.5 fL (ref 78.0–100.0)
Platelets: 89 10*3/uL — ABNORMAL LOW (ref 150–400)
RBC: 2.85 MIL/uL — ABNORMAL LOW (ref 3.87–5.11)
RDW: 14.2 % (ref 11.5–15.5)
WBC: 9.6 10*3/uL (ref 4.0–10.5)

## 2015-07-07 NOTE — Progress Notes (Signed)
Mom encouraged to place baby skin to skin as often as possible as well as feeding.

## 2015-07-07 NOTE — Lactation Note (Signed)
This note was copied from a baby's chart. Lactation Consultation Note Called into rm for latch assistance. Mom was holding baby w/mom's body posture leaning to the Lt. Side. Mom stated "it hurts to sit up straight on her bottom".  Asked mom if she could tolerate it for a short time to BF the baby and I would assist. Mom stated yes. Mom has semi flat nipples that do evert some after stimulation. Mom has LE edema. Moms areola not compressible much, causing Rt. Nipple to flatten when compressed. Baby was frustrated d/t nipple being flat, not able to sand which much tissue into baby's mouth, then baby unable to hold latch w/o assistance. Mom stated baby latched onto the Lt. Nipple much better. Lt. Nipple everted for easier latch. Hand expression taught w/no colostrum noted. Fitted mom w/#16 NS. Baby latched in football position to Rt. Breast mom using "C" hold. Took baby a while and finally suckled for approx. 5 minutes total off and on. Answered lots of questions mom had. Noted 1 drop of colostrum in NS. Fitted mom w/#20 NS. Encouraged mom to use #20NS for next feeding. Have mom hand pump to pre-pump to evert nipple prior to applying NS. Applied shells to assist in everting nipples.  Patient Name: Girl Virgel ManifoldJessica Kass ONGEX'BToday's Date: 07/07/2015 Reason for consult: Follow-up assessment;Difficult latch   Maternal Data    Feeding Feeding Type: Breast Fed Length of feed: 5 min  LATCH Score/Interventions Latch: Repeated attempts needed to sustain latch, nipple held in mouth throughout feeding, stimulation needed to elicit sucking reflex. Intervention(s): Skin to skin;Teach feeding cues;Waking techniques Intervention(s): Adjust position;Assist with latch;Breast massage;Breast compression  Audible Swallowing: None Intervention(s): Skin to skin;Hand expression  Type of Nipple: Everted at rest and after stimulation (semi flat)  Comfort (Breast/Nipple): Soft / non-tender     Hold (Positioning): Full  assist, staff holds infant at breast Intervention(s): Breastfeeding basics reviewed;Support Pillows;Position options;Skin to skin  LATCH Score: 5  Lactation Tools Discussed/Used Tools: Shells;Nipple Dorris CarnesShields;Pump Nipple shield size: 20;16 Shell Type: Inverted Breast pump type: Manual Pump Review: Setup, frequency, and cleaning;Milk Storage Initiated by:: Peri JeffersonL. Elani Delph RN Date initiated:: 07/07/15   Consult Status Consult Status: Follow-up Date: 07/07/15 Follow-up type: In-patient    Elania Crowl, Diamond NickelLAURA G 07/07/2015, 1:20 AM

## 2015-07-07 NOTE — Progress Notes (Signed)
CSW acknowledged consult for anxiety for Ariel Andrews.  Ariel Andrews stated that she does not have any anxiety, and that she feels happy and excited about being a new parent.  Ariel Andrews communicated that once during her prenatal visit, she asked questions about feeling anxious and the affects it will have on her unborn child.  Ariel Andrews communicated that it was an isolated incident, and no additional information or services were needed.  Ariel Andrews declined resources and referrals for MH services.  However, Ariel Andrews and FOB were interested in learning about PPD.  Both parents asked appropriate questions, and CSW provided them with interventions and resources if warranted. CSW encouraged Ariel Andrews to contact CSW if she had any additional questions or concerns.

## 2015-07-07 NOTE — Progress Notes (Signed)
Assumed care of mom and baby. Baby nursing well. Latch observed. Family at bedside. 

## 2015-07-07 NOTE — Progress Notes (Signed)
Post Partum Day 1 Subjective:  Ariel Andrews is a 25 y.o. G1P1001 867w1d s/p SVD after IOL for Pre-eclampsia.  No acute events overnight.  Pt denies problems with ambulating, voiding or po intake.  She denies nausea or vomiting.  Pain is well controlled.  Lochia Minimal.  Plan for birth control is condoms.  Method of Feeding: breast  Objective: Blood pressure 115/66, pulse 62, temperature 98 F (36.7 C), temperature source Oral, resp. rate 18, height 5\' 3"  (1.6 m), weight 177 lb 12.8 oz (80.65 kg), last menstrual period 10/05/2014, SpO2 100 %, unknown if currently breastfeeding.  Physical Exam:  General: alert, cooperative and no distress Lochia:normal flow Chest: normal WOB Heart: Regular rate Abdomen: +BS, soft, mild TTP (appropriate) Uterine Fundus: firm DVT Evaluation: No evidence of DVT seen on physical exam. Extremities: 1+ edema   Recent Labs  07/06/15 0524 07/07/15 0514  HGB 10.9* 8.9*  HCT 31.2* 25.5*    Assessment/Plan:  ASSESSMENT: Ariel Andrews is a 25 y.o. G1P1001 167w1d s/p SVD afer IOL for Pre-E. Platelet 89 from 126 on admission. All cell lines down which suggests hemodilution than severe features. BP' within normal range.  Plan for discharge tomorrow Continue routine PP care Breastfeeding support PRN  LOS: 3 days   Almon Herculesaye T Gonfa 07/07/2015, 9:19 AM   CNM attestation Post Partum Day #1 I have seen and examined this patient and agree with above documentation in the resident's note.   Ariel Andrews is a 25 y.o. G1P1001 s/p SVD.  Pt denies problems with ambulating, voiding or po intake. Pain is well controlled.  Plan for birth control is condoms.  Method of Feeding: breast  PE:  BP 115/66 mmHg  Pulse 62  Temp(Src) 98 F (36.7 C) (Oral)  Resp 18  Ht 5\' 3"  (1.6 m)  Wt 80.65 kg (177 lb 12.8 oz)  BMI 31.50 kg/m2  SpO2 100%  LMP 10/05/2014 (Approximate)  Breastfeeding? Unknown Fundus firm  Plan for discharge: 07/08/15  Cam HaiSHAW, KIMBERLY, CNM 4:12  PM 07/07/2015

## 2015-07-07 NOTE — Lactation Note (Signed)
This note was copied from a baby's chart. Lactation Consultation Note  Reviewed hand expression and mother was able to express drops. Mother has abrasions on tips of nipples.  Recommend applying ebm, coconut oil and wearing shells to help evert nipples and prevent more soreness. Attempted latching baby but baby sleeping. Reviewed positioning and massage/compressing breasts during feeding. Mother has been able to latch baby 2x this morning without NS. Recommend if she does use NS to be sure and pumped 3-4 x a day for 10-15 min and give baby back volume pumped. Reminded mother to do lots of STS and breastfeed on both breasts per feeding. Provided mother w/ #16& #20NS. Mom encouraged to feed baby 8-12 times/24 hours and with feeding cues.  Reviewed engorgement care and monitoring voids/stools.   Patient Name: Ariel Andrews     Maternal Data    Feeding Feeding Type: Breast Fed Length of feed: 5 min  LATCH Score/Interventions Latch: Repeated attempts needed to sustain latch, nipple held in mouth throughout feeding, stimulation needed to elicit sucking reflex. Intervention(s): Skin to skin Intervention(s): Assist with latch  Audible Swallowing: Spontaneous and intermittent Intervention(s): Skin to skin  Type of Nipple: Everted at rest and after stimulation  Comfort (Breast/Nipple): Filling, red/small blisters or bruises, mild/mod discomfort  Problem noted: Mild/Moderate discomfort  Hold (Positioning): Assistance needed to correctly position infant at breast and maintain latch.  LATCH Score: 7  Lactation Tools Discussed/Used Tools: Nipple Ariel CarnesShields (mom states nipple nshield hurts)   Consult Status      Ariel Andrews, Ariel Andrews Andrews, 10:30 AM

## 2015-07-08 ENCOUNTER — Ambulatory Visit: Payer: Self-pay

## 2015-07-08 NOTE — Discharge Summary (Signed)
OB Discharge Summary     Patient Name: Ariel ManifoldJessica Clouatre DOB: 07/27/1990 MRN: [id number]  Date of admission: 07/04/2015 Delivering MD: Cam HaiSHAW, Tykia Mellone D   Date of discharge: 07/08/2015  Admitting diagnosis: 39wks hbp 131,96 Intrauterine pregnancy: 6174w1d     Secondary diagnosis:  Active Problems:   Supervision of normal pregnancy, antepartum   Psychogenic vaginismus   Fetal pyelectasis   Thrombocytopenia affecting pregnancy (HCC)   Mild preeclampsia   GBS (group B Streptococcus carrier), +RV culture, currently pregnant  Additional problems: none     Discharge diagnosis: Term Pregnancy Delivered, Preeclampsia (mild) and PPH                                                                                                Post partum procedures:none  Augmentation: AROM, Pitocin and Cytotec  Complications: None  Hospital course:  Induction of Labor With Vaginal Delivery   25 y.o. yo G1P1001 at 7874w1d was admitted to the hospital 07/04/2015 for induction of labor.  Indication for induction: Preeclampsia. Pt has been using the [facility] during the pregnancy and had a single elevated BP/elevated weight and was told to come in from induction. She had cytotec x 2 doses for cx ripening followed by Pitocin which brought her complete and deilvery. Patient had an uncomplicated labor course as follows: Membrane Rupture Time/Date: 2:40 AM ,07/06/2015   Intrapartum Procedures: Episiotomy: None [1]                                         Lacerations:  Labial [10]  Patient had delivery of a Viable infant.  Information for the patient's newborn:  Eduard ClosBethea, Girl Shanda BumpsJessica [[id number]][[id number]]  Delivery Method: Vaginal, Spontaneous Delivery (Filed from Delivery Summary)   07/06/2015  Details of delivery can be found in separate delivery note. Some clots were evacuated during the immediate postpartum, in combination with the bleeding from her repair, resulting in a mild PPH of 600cc. Her hgb remained stable and  she was asymptomatic.  Patient had a routine postpartum course, with the exception of her platelets dropping to 89K on PPD#1. Patient is discharged home 07/08/2015.   Physical exam  Filed Vitals:   07/06/15 2047 07/07/15 0625 07/07/15 1700 07/08/15 0525  BP: 128/61 115/66 133/89 113/61  Pulse: 60 62 71 70  Temp: 97.9 F (36.6 C) 98 F (36.7 C) 98 F (36.7 C) 98 F (36.7 C)  TempSrc: Oral Oral Oral   Resp: 16 18 18 18   Height:      Weight:      SpO2:       General: alert and cooperative Lochia: appropriate Uterine Fundus: firm Incision: N/A DVT Evaluation: No evidence of DVT seen on physical exam. Labs: Lab Results  Component Value Date   WBC 9.6 07/07/2015   HGB 8.9* 07/07/2015   HCT 25.5* 07/07/2015   MCV 89.5 07/07/2015   PLT 89* 07/07/2015   CMP Latest Ref Rng 07/04/2015  Glucose 65 - 99 mg/dL 99  BUN 6 -  20 mg/dL 9  Creatinine 1.090.44 - 6.041.00 mg/dL 5.400.63  Sodium 981135 - 191145 mmol/L 134(L)  Potassium 3.5 - 5.1 mmol/L 3.4(L)  Chloride 101 - 111 mmol/L 104  CO2 22 - 32 mmol/L 24  Calcium 8.9 - 10.3 mg/dL 4.7(W8.5(L)  Total Protein 6.5 - 8.1 g/dL 6.1(L)  Total Bilirubin 0.3 - 1.2 mg/dL 0.7  Alkaline Phos 38 - 126 U/L 168(H)  AST 15 - 41 U/L 24  ALT 14 - 54 U/L 15    Discharge instruction: per After Visit Summary and "Baby and Me Booklet".  After visit meds:    Medication List    STOP taking these medications        OVER THE COUNTER MEDICATION      TAKE these medications        acetaminophen 500 MG tablet  Commonly known as:  TYLENOL  Take 1,000 mg by mouth every 6 (six) hours as needed for moderate pain.     docusate sodium 100 MG capsule  Commonly known as:  COLACE  TAKE 1 CAPSULE (100 MG TOTAL) BY MOUTH 2 (TWO) TIMES DAILY AS NEEDED.     PRENATAL VITAMIN PLUS LOW IRON 27-1 MG Tabs  Take 1 tablet by mouth daily.        Diet: routine diet  Activity: Advance as tolerated. Pelvic rest for 6 weeks.   Outpatient follow up:6 weeks Follow up Appt:No future  appointments. Follow up Visit:No Follow-up on file.  Postpartum contraception: Condoms  Newborn Data: Live born female  Birth Weight: 8 lb 9.6 oz (3900 g) APGAR: 9, 9  Baby Feeding: Breast Disposition:home with mother   07/08/2015 Cam HaiSHAW, Marabeth Melland, CNM  9:05 AM

## 2015-07-08 NOTE — Lactation Note (Addendum)
This note was copied from a baby's chart. Lactation Consultation Note  Patient Name: Ariel Virgel ManifoldJessica Waltner MVHQI'OToday's Date: 07/08/2015 Reason for consult: Follow-up assessment;Other (Comment) (6% weight loss, @ 45 mins - 5.6 )  Baby 57 hours old, and has been feeding consistently. Per mom baby is latching fine on the left   and with #20 NS on the right. Per mom has not tried without the NS.  My nipples are sore and I'm using the shells which are helping. [facility] and [facility] NS to prevent soreness.  Mom wearing a sleepier breast feeding bra and it wasn't helping the shells stay in the proper location,  Therefore was creating swelling at the base of the areolas.  LC recommended to mom to buy a support ive bra that would keep the shells in place and they would  Help move the edema away from the nipples.  Baby awake at consult and LC offered to assist, worked on positioning and depth at the breast , initially  Mom having discomfort and per mom latch comfort improved. Baby fed for 15 mins and mom released suction  And nipple well rounded. @ the same consult - switched to the left breast / football. Worked on depth and positioning.  Discomfort with latch seemed to be longer than the right side, but eventually obtained. No NS used for either latch.  Sore nipple and engorgement prevention and tx reviewed. Mom already has coconut oil , comfort gels provided with instructions. Mom already has hand pump, and a DEBP at home.  LC recommendation prior to latch - 1st breast - breast massage , hand express, pre- pump with hand pump ( few strokes 7-8 )  And reverse pressure exercise . Goal is to make the areola more compressible for a deeper LATCH to decrease pain with latch .  LC stress the positive is that her breast are filling, depth achieved and multiply swallows noted.  LC recommended since she will be seeing and [place] of [place] [facility] on  [place] to have the [facility] MD check the baby's tongue mobility.  LC suspects there is a oral variance and if she is still sore in 3 days to [facility]  LC stressed the importance of feedings at least 8 times a day and if the latch becomes to painful due to the soreness already being present  To feed with a broad based medium nipple and when feeding tickle the upper lip until the baby opens wide and make sure the lips are flanged  Mom , dad , grandmother receptive to teaching.  Mother informed of post-discharge support and given phone number to the lactation department, including services for phone call assistance;  out-patient appointments; and breastfeeding support group. List of other breastfeeding resources in the community given in the handout. Encouraged  mother to call for problems or concerns related to breastfeeding. LC sized mom for #20 NS - to small , #24 NS fits correct if needed.      Maternal Data Has patient been taught Hand Expression?: Yes  Feeding Feeding Type: Breast Fed Length of feed: 40 min  LATCH Score/Interventions Latch: Grasps breast easily, tongue down, lips flanged, rhythmical sucking. Intervention(s): Skin to skin;Teach feeding cues;Waking techniques Intervention(s): Adjust position;Assist with latch;Breast massage;Breast compression  Audible Swallowing: Spontaneous and intermittent  Type of Nipple: Everted at rest and after stimulation  Comfort (Breast/Nipple): Filling, red/small  blisters or bruises, mild/mod discomfort  Problem noted: Filling  Hold (Positioning): Assistance needed to correctly position infant at breast and maintain latch. (worked on depth ) Intervention(s): Breastfeeding basics reviewed;Support Pillows;Position options;Skin to skin  LATCH Score: 8  Lactation Tools Discussed/Used Tools: Shells;Pump Nipple shield size:  (no NS with latch ) Shell Type: Inverted Breast pump type: Manual   Consult  Status Consult Status: Complete Date: 07/08/15    Kathrin Greathouseorio, Bernd Crom Ann 07/08/2015, 3:01 PM

## 2015-07-08 NOTE — Progress Notes (Signed)
Utilization review completed.  L. J. Chiniqua Kilcrease RN, BSN, CM 

## 2015-07-08 NOTE — Discharge Instructions (Signed)

## 2015-07-11 ENCOUNTER — Inpatient Hospital Stay (HOSPITAL_COMMUNITY)
Admission: AD | Admit: 2015-07-11 | Discharge: 2015-07-12 | Disposition: A | Discharge status: Home / Self Care | Payer: 59 | Source: Ambulatory Visit | Attending: Family Medicine | Admitting: Family Medicine

## 2015-07-11 DIAGNOSIS — O1495 Unspecified pre-eclampsia, complicating the puerperium: Secondary | ICD-10-CM | POA: Diagnosis present

## 2015-07-11 NOTE — MAU Note (Signed)
Pt arrives with complaint of elevated B/P at home, states some blurred vision but denies other complaints.

## 2015-07-12 ENCOUNTER — Encounter (HOSPITAL_COMMUNITY): Payer: Self-pay

## 2015-07-12 DIAGNOSIS — O1495 Unspecified pre-eclampsia, complicating the puerperium: Secondary | ICD-10-CM | POA: Diagnosis not present

## 2015-07-12 LAB — CBC
HEMATOCRIT: 31.5 % — AB (ref 36.0–46.0)
Hemoglobin: 10.7 g/dL — ABNORMAL LOW (ref 12.0–15.0)
MCH: 30.1 pg (ref 26.0–34.0)
MCHC: 34 g/dL (ref 30.0–36.0)
MCV: 88.5 fL (ref 78.0–100.0)
PLATELETS: 253 10*3/uL (ref 150–400)
RBC: 3.56 MIL/uL — ABNORMAL LOW (ref 3.87–5.11)
RDW: 13.7 % (ref 11.5–15.5)
WBC: 10 10*3/uL (ref 4.0–10.5)

## 2015-07-12 LAB — PROTEIN / CREATININE RATIO, URINE: Total Protein, Urine: 8 mg/dL

## 2015-07-12 LAB — URINE MICROSCOPIC-ADD ON

## 2015-07-12 LAB — URINALYSIS, ROUTINE W REFLEX MICROSCOPIC
Bilirubin Urine: NEGATIVE
GLUCOSE, UA: NEGATIVE mg/dL
Ketones, ur: NEGATIVE mg/dL
Nitrite: NEGATIVE
PROTEIN: NEGATIVE mg/dL
pH: 6 (ref 5.0–8.0)

## 2015-07-12 LAB — COMPREHENSIVE METABOLIC PANEL
ALBUMIN: 3.3 g/dL — AB (ref 3.5–5.0)
ALT: 32 U/L (ref 14–54)
AST: 25 U/L (ref 15–41)
Alkaline Phosphatase: 127 U/L — ABNORMAL HIGH (ref 38–126)
Anion gap: 9 (ref 5–15)
BUN: 15 mg/dL (ref 6–20)
CHLORIDE: 108 mmol/L (ref 101–111)
CO2: 22 mmol/L (ref 22–32)
CREATININE: 0.66 mg/dL (ref 0.44–1.00)
Calcium: 8.8 mg/dL — ABNORMAL LOW (ref 8.9–10.3)
GFR calc Af Amer: >60 mL/min (ref >60)
GFR calc non Af Amer: >60 mL/min (ref >60)
GLUCOSE: 104 mg/dL — AB (ref 65–99)
POTASSIUM: 3.8 mmol/L (ref 3.5–5.1)
Sodium: 139 mmol/L (ref 135–145)
Total Bilirubin: 0.3 mg/dL (ref 0.3–1.2)
Total Protein: 7.1 g/dL (ref 6.5–8.1)

## 2015-07-12 MED ORDER — AMLODIPINE BESYLATE 5 MG PO TABS: 5.0000 mg | ORAL_TABLET | Freq: Every day | ORAL | Status: DC

## 2015-07-12 MED ORDER — LABETALOL HCL 5 MG/ML IV SOLN
20.0000 mg | INTRAVENOUS | Status: DC | PRN
Start: 2015-07-12 — End: 2015-07-12
  Administered 2015-07-12: 20 mg via INTRAVENOUS
  Filled 2015-07-12: qty 4

## 2015-07-12 MED ORDER — AMLODIPINE BESYLATE 5 MG PO TABS
5.0000 mg | ORAL_TABLET | Freq: Once | ORAL | Status: AC
  Administered 2015-07-12: 5 mg via ORAL
  Filled 2015-07-12: qty 1

## 2015-07-12 MED ORDER — HYDRALAZINE HCL 20 MG/ML IJ SOLN: 10.0000 mg | Freq: Once | INTRAMUSCULAR | Status: DC | PRN

## 2015-07-12 NOTE — MAU Provider Note (Signed)
History     CSN: [id number]  Arrival date and time: 07/11/15 2339   First Provider Initiated Contact with Patient 07/12/15 0030      No chief complaint on file.  HPI   Ariel Andrews is a 25 y.o. female G1P1001 post partum, status post vaginal delivery on Thursday 6/22, the patient was IOL for preeclampsia.  She noted some blurry vision in the hospital and it comes and goes since her delivery. "its not really that noticeable" Denies any changes now.   She has a BP cuff at home because she was baby scripts, she randomly checked her BP at home and it was high. She has no complaints at this time.   OB History    Gravida Para Term Preterm AB TAB SAB Ectopic Multiple Living   '1 1 1      ' 0 1      History reviewed. No pertinent past medical history.  Past Surgical History  Procedure Laterality Date  . Cyst excision      History reviewed. No pertinent family history.  Social History  Substance Use Topics  . Smoking status: Never Smoker   . Smokeless tobacco: None  . Alcohol Use: No    Allergies: No Known Allergies  Prescriptions prior to admission  Medication Sig Dispense Refill Last Dose  . acetaminophen (TYLENOL) 500 MG tablet Take 1,000 mg by mouth every 6 (six) hours as needed for moderate pain.   07/04/2015 at Unknown time  . docusate sodium (COLACE) 100 MG capsule TAKE 1 CAPSULE (100 MG TOTAL) BY MOUTH 2 (TWO) TIMES DAILY AS NEEDED. 30 capsule 2 07/03/2015 at Unknown time  . Prenatal Vit-Fe Fumarate-FA (PRENATAL VITAMIN PLUS LOW IRON) 27-1 MG TABS Take 1 tablet by mouth daily. (Patient taking differently: Take 1 tablet by mouth daily. ) 30 tablet 11 07/03/2015 at Unknown time   Results for orders placed or performed during the hospital encounter of 07/11/15 (from the past 48 hour(s))  Urinalysis, Routine w reflex microscopic (not at [facility])     Status: Abnormal   Collection Time: 07/11/15 11:39 PM  Result Value Ref Range   Color, Urine YELLOW YELLOW   APPearance CLEAR  CLEAR   Specific Gravity, Urine <1.005 (L) 1.005 - 1.030   pH 6.0 5.0 - 8.0   Glucose, UA NEGATIVE NEGATIVE mg/dL   Hgb urine dipstick LARGE (A) NEGATIVE   Bilirubin Urine NEGATIVE NEGATIVE   Ketones, ur NEGATIVE NEGATIVE mg/dL   Protein, ur NEGATIVE NEGATIVE mg/dL   Nitrite NEGATIVE NEGATIVE   Leukocytes, UA LARGE (A) NEGATIVE  Protein / creatinine ratio, urine     Status: None   Collection Time: 07/11/15 11:39 PM  Result Value Ref Range   Creatinine, Urine <10 mg/dL    Comment: REPEATED TO VERIFY   Total Protein, Urine 8 mg/dL    Comment: NO NORMAL RANGE ESTABLISHED FOR THIS TEST   Protein Creatinine Ratio        0.00 - 0.15 mg/mg[Cre]    Comment: RESULT BELOW REPORTABLE RANGE, UNABLE TO CALCULATE.   Urine microscopic-add on     Status: Abnormal   Collection Time: 07/11/15 11:39 PM  Result Value Ref Range   Squamous Epithelial / LPF 0-5 (A) NONE SEEN   WBC, UA 6-30 0 - 5 WBC/hpf   RBC / HPF 6-30 0 - 5 RBC/hpf   Bacteria, UA FEW (A) NONE SEEN  CBC     Status: Abnormal   Collection Time: 07/12/15  1:05 AM  Result  Value Ref Range   WBC 10.0 4.0 - 10.5 K/uL   RBC 3.56 (L) 3.87 - 5.11 MIL/uL   Hemoglobin 10.7 (L) 12.0 - 15.0 g/dL   HCT 31.5 (L) 36.0 - 46.0 %   MCV 88.5 78.0 - 100.0 fL   MCH 30.1 26.0 - 34.0 pg   MCHC 34.0 30.0 - 36.0 g/dL   RDW 13.7 11.5 - 15.5 %   Platelets 253 150 - 400 K/uL  Comprehensive metabolic panel     Status: Abnormal   Collection Time: 07/12/15  1:05 AM  Result Value Ref Range   Sodium 139 135 - 145 mmol/L   Potassium 3.8 3.5 - 5.1 mmol/L   Chloride 108 101 - 111 mmol/L   CO2 22 22 - 32 mmol/L   Glucose, Bld 104 (H) 65 - 99 mg/dL   BUN 15 6 - 20 mg/dL   Creatinine, Ser 0.66 0.44 - 1.00 mg/dL   Calcium 8.8 (L) 8.9 - 10.3 mg/dL   Total Protein 7.1 6.5 - 8.1 g/dL   Albumin 3.3 (L) 3.5 - 5.0 g/dL   AST 25 15 - 41 U/L   ALT 32 14 - 54 U/L   Alkaline Phosphatase 127 (H) 38 - 126 U/L   Total Bilirubin 0.3 0.3 - 1.2 mg/dL   GFR calc non Af  Amer >60 >60 mL/min   GFR calc Af Amer >60 >60 mL/min    Comment: (NOTE) The eGFR has been calculated using the CKD EPI equation. This calculation has not been validated in all clinical situations. eGFR's persistently <60 mL/min signify possible Chronic Kidney Disease.    Anion gap 9 5 - 15    Review of Systems  Eyes: Negative for blurred vision.  Respiratory: Negative for shortness of breath.   Cardiovascular: Negative for leg swelling.  Gastrointestinal: Negative for abdominal pain.  Neurological: Negative for headaches.   Physical Exam   Blood pressure 157/91, pulse 69, temperature 98.4 F (36.9 C), temperature source Oral, resp. rate 16, last menstrual period 10/05/2014, SpO2 100 %, unknown if currently breastfeeding.   Patient Vitals for the past 24 hrs:  BP Temp Temp src Pulse Resp SpO2  07/12/15 0333 133/80 mmHg - - 67 - -  07/12/15 0202 136/67 mmHg - - (!) 54 - -  07/12/15 0141 155/77 mmHg - - 60 - -  07/12/15 0131 155/84 mmHg - - 61 - -  07/12/15 0127 148/73 mmHg - - (!) 54 - -  07/12/15 0117 175/92 mmHg - - 63 - -  07/12/15 0101 173/85 mmHg - - 62 - -  07/12/15 0047 169/93 mmHg - - 64 - -  07/12/15 0032 162/85 mmHg - - (!) 52 - -  07/12/15 0016 157/91 mmHg - - 69 - -  07/12/15 0004 158/90 mmHg - - 66 - -  07/11/15 2354 150/92 mmHg - - (!) 56 - -  07/11/15 2348 158/93 mmHg 98.4 F (36.9 C) Oral (!) 55 16 100 %  07/11/15 2335 162/99 mmHg 97.7 F (36.5 C) Oral 66 18 100 %    Physical Exam  Constitutional: She is oriented to person, place, and time. She appears well-developed and well-nourished. No distress.  HENT:  Head: Normocephalic.  Eyes: Pupils are equal, round, and reactive to light.  Neck: Neck supple.  Respiratory: Effort normal.  GI: Soft. She exhibits no distension. There is no tenderness. There is no rebound.  Musculoskeletal: Normal range of motion.  Neurological: She is alert and oriented to person,  place, and time. She displays normal reflexes.   Negative clonus   Skin: Skin is warm. She is not diaphoretic.  Psychiatric: Her behavior is normal.    MAU Course  Procedures  None  MDM  Labetalol 20 mg given IV Norvasc 5 mg PO given In MAU  Discussed patient with Dr. Kennon Rounds, [facility] to dc if pressures remain down from norvasc.  133/80- discharge blood pressure.   Assessment and Plan    A:  1. Preeclampsia in postpartum period     P:  Discharge home in stable condition Rx: Norvasc Follow up in the [place] for a BP check on Friday '@0900' - appointment made in the book  Preeclampsia precautions.  Return to MAU if symptoms worsen   Lezlie Lye, NP 07/12/2015 7:09 AM

## 2015-07-12 NOTE — Progress Notes (Signed)
Notified of pt BP. Will discharge pt home

## 2015-07-12 NOTE — Discharge Instructions (Signed)

## 2015-07-14 ENCOUNTER — Ambulatory Visit: Payer: 59 | Admitting: *Deleted

## 2015-07-14 VITALS — BP 138/99

## 2015-07-14 DIAGNOSIS — Z0130 Encounter for examination of blood pressure without abnormal findings: Secondary | ICD-10-CM

## 2015-07-14 NOTE — Progress Notes (Signed)
Pt in for bp check. Pt denies any headaches blurry vision or swelling.  Discussed reading with Dr. Erin FullingHarraway Smith and she advised pt to stay on her bp meds and to followup for pp visit. Pt agreeable and had no further questions.

## 2015-08-11 ENCOUNTER — Other Ambulatory Visit: Payer: Self-pay | Admitting: *Deleted

## 2015-08-11 DIAGNOSIS — O139 Gestational [pregnancy-induced] hypertension without significant proteinuria, unspecified trimester: Secondary | ICD-10-CM

## 2015-08-11 MED ORDER — AMLODIPINE BESYLATE 5 MG PO TABS: 5.0000 mg | ORAL_TABLET | Freq: Every day | ORAL | 0 refills | Status: DC

## 2015-08-11 NOTE — Telephone Encounter (Signed)
Pt called requesting a RF on her Norvasc.  She is scheduled for her PP visit on Friday with Artelia Laroche, CNM

## 2015-08-18 ENCOUNTER — Encounter: Payer: Self-pay | Admitting: Advanced Practice Midwife

## 2015-08-18 ENCOUNTER — Ambulatory Visit (INDEPENDENT_AMBULATORY_CARE_PROVIDER_SITE_OTHER): Payer: 59 | Admitting: Advanced Practice Midwife

## 2015-08-18 DIAGNOSIS — O1400 Mild to moderate pre-eclampsia, unspecified trimester: Secondary | ICD-10-CM

## 2015-08-18 NOTE — Progress Notes (Signed)
Subjective:     Ariel Andrews is a 25 y.o. female who presents for a postpartum visit. She is 6 weeks postpartum following a spontaneous vaginal delivery. I have fully reviewed the prenatal and intrapartum course. The delivery was at term gestational weeks. Outcome: spontaneous vaginal delivery. Anesthesia: epidural. Postpartum course has been remarkable for postpartum preeclampsia, which required hospitalization and postpartum antihypertensive meds.. Baby's course has been uneventful. Baby is feeding by breast. Bleeding red and Menses. Bowel function is constipated. Bladder function is normal. Patient is not sexually active. Contraception method is condoms. Postpartum depression screening: negative.  The following portions of the patient's history were reviewed and updated as appropriate: allergies, current medications, past family history, past medical history, past social history, past surgical history and problem list.  Review of Systems Pertinent items are noted in HPI.   Objective:    BP 133/74   Pulse 82   Resp 16   Ht 5\' 3"  (1.6 m)   Wt 140 lb (63.5 kg)   Breastfeeding? Yes   BMI 24.80 kg/m   General:  alert, cooperative and no distress   Breasts:  inspection negative, no nipple discharge or bleeding, no masses or nodularity palpable  Lungs: clear to auscultation bilaterally  Heart:  regular rate and rhythm, S1, S2 normal, no murmur, click, rub or gallop  Abdomen: soft, non-tender; bowel sounds normal; no masses,  no organomegaly   Vulva:  normal and well healed  Vagina: normal vagina, no discharge, exudate, lesion, or erythema  Cervix:  no lesions  Corpus: not examined  Adnexa:  not evaluated  Rectal Exam: Not performed.        BP last week:  144/92, 121/90, 112/83, 128/94, 104/88,  BP this week:   111/75,99/60,120/85,108/75,91/73,102/71   Assessment:     Normal postpartum exam. Pap smear not done at today's visit.   Preeclampsia in peripartum period, now  improved Constipation  Plan:    1. Contraception: condoms 2. Plan pap later, pt on period today 3. Follow up in: 1 month or as needed.   4.  Discussed hypertension in detail.  BPs doing better. Will try her off meds for a week and she will monitor at home and call in.  If they go back up, will restart meds. Is in process of getting a family doctor for long term checks. Discussed importance of long term followup with hypertension. 5.  Add fiber for constipation

## 2015-08-18 NOTE — Patient Instructions (Addendum)
Place postpartum visit patient instructions here.  Hypertension Hypertension, commonly called high blood pressure, is when the force of blood pumping through your arteries is too strong. Your arteries are the blood vessels that carry blood from your heart throughout your body. A blood pressure reading consists of a higher number over a lower number, such as 110/72. The higher number (systolic) is the pressure inside your arteries when your heart pumps. The lower number (diastolic) is the pressure inside your arteries when your heart relaxes. Ideally you want your blood pressure below 120/80. Hypertension forces your heart to work harder to pump blood. Your arteries may become narrow or stiff. Having untreated or uncontrolled hypertension can cause heart attack, stroke, kidney disease, and other problems. RISK FACTORS Some risk factors for high blood pressure are controllable. Others are not.  Risk factors you cannot control include:   Race. You may be at higher risk if you are African American.  Age. Risk increases with age.  Gender. Men are at higher risk than women before age 46 years. After age 38, women are at higher risk than men. Risk factors you can control include:  Not getting enough exercise or physical activity.  Being overweight.  Getting too much fat, sugar, calories, or salt in your diet.  Drinking too much alcohol. SIGNS AND SYMPTOMS Hypertension does not usually cause signs or symptoms. Extremely high blood pressure (hypertensive crisis) may cause headache, anxiety, shortness of breath, and nosebleed. DIAGNOSIS To check if you have hypertension, your health care provider will measure your blood pressure while you are seated, with your arm held at the level of your heart. It should be measured at least twice using the same arm. Certain conditions can cause a difference in blood pressure between your right and left arms. A blood pressure reading that is higher than normal on  one occasion does not mean that you need treatment. If it is not clear whether you have high blood pressure, you may be asked to return on a different day to have your blood pressure checked again. Or, you may be asked to monitor your blood pressure at home for 1 or more weeks. TREATMENT Treating high blood pressure includes making lifestyle changes and possibly taking medicine. Living a healthy lifestyle can help lower high blood pressure. You may need to change some of your habits. Lifestyle changes may include:  Following the DASH diet. This diet is high in fruits, vegetables, and whole grains. It is low in salt, red meat, and added sugars.  Keep your sodium intake below 2,300 mg per day.  Getting at least 30-45 minutes of aerobic exercise at least 4 times per week.  Losing weight if necessary.  Not smoking.  Limiting alcoholic beverages.  Learning ways to reduce stress. Your health care provider may prescribe medicine if lifestyle changes are not enough to get your blood pressure under control, and if one of the following is true:  You are 73-63 years of age and your systolic blood pressure is above 140.  You are 11 years of age or older, and your systolic blood pressure is above 150.  Your diastolic blood pressure is above 90.  You have diabetes, and your systolic blood pressure is over 140 or your diastolic blood pressure is over 90.  You have kidney disease and your blood pressure is above 140/90.  You have heart disease and your blood pressure is above 140/90. Your personal target blood pressure may vary depending on your medical conditions, your  age, and other factors. HOME CARE INSTRUCTIONS  Have your blood pressure rechecked as directed by your health care provider.   Take medicines only as directed by your health care provider. Follow the directions carefully. Blood pressure medicines must be taken as prescribed. The medicine does not work as well when you skip doses.  Skipping doses also puts you at risk for problems.  Do not smoke.   Monitor your blood pressure at home as directed by your health care provider. SEEK MEDICAL CARE IF:   You think you are having a reaction to medicines taken.  You have recurrent headaches or feel dizzy.  You have swelling in your ankles.  You have trouble with your vision. SEEK IMMEDIATE MEDICAL CARE IF:  You develop a severe headache or confusion.  You have unusual weakness, numbness, or feel faint.  You have severe chest or abdominal pain.  You vomit repeatedly.  You have trouble breathing. MAKE SURE YOU:   Understand these instructions.  Will watch your condition.  Will get help right away if you are not doing well or get worse.   This information is not intended to replace advice given to you by your health care provider. Make sure you discuss any questions you have with your health care provider.   Document Released: 12/31/2004 Document Revised: 05/17/2014 Document Reviewed: 10/23/2012 Elsevier Interactive Patient Education [employer]

## 2015-09-01 ENCOUNTER — Telehealth: Payer: Self-pay | Admitting: *Deleted

## 2015-09-01 NOTE — Telephone Encounter (Signed)
Pt called to report her BP reading over the last week off her BP med.  They are as follows: 124/81,124/84,123/80,130/88,121/84,105/76.  She is to continue off her meds and return for appt in 4 weeks.

## 2015-09-26 ENCOUNTER — Ambulatory Visit: Payer: 59 | Admitting: Obstetrics and Gynecology

## 2015-10-11 ENCOUNTER — Encounter: Payer: Self-pay | Admitting: Obstetrics & Gynecology

## 2015-10-11 ENCOUNTER — Encounter (INDEPENDENT_AMBULATORY_CARE_PROVIDER_SITE_OTHER): Payer: Self-pay

## 2015-10-11 ENCOUNTER — Ambulatory Visit (INDEPENDENT_AMBULATORY_CARE_PROVIDER_SITE_OTHER): Payer: 59 | Admitting: Obstetrics & Gynecology

## 2015-10-11 VITALS — BP 110/70 | HR 80 | Resp 16 | Ht 64.0 in | Wt 141.0 lb

## 2015-10-11 DIAGNOSIS — O1495 Unspecified pre-eclampsia, complicating the puerperium: Secondary | ICD-10-CM

## 2015-10-11 DIAGNOSIS — IMO0001 Reserved for inherently not codable concepts without codable children: Secondary | ICD-10-CM

## 2015-10-11 DIAGNOSIS — R030 Elevated blood-pressure reading, without diagnosis of hypertension: Principal | ICD-10-CM

## 2015-10-11 NOTE — Progress Notes (Signed)
   Subjective:    Patient ID: Virgel ManifoldJessica Buehler, female    DOB: 02/16/1990, 25 y.o.   MRN: [id number]  HPI 25 yo MW 69P1 (10month old baby at home) here for a BP check. She had pre eclampsia and has now been off of her BP meds for a week.    Review of Systems     Objective:   Physical Exam [id number] Breathing, conversing, and ambulating normally BP is 101/70 today       Assessment & Plan:  Reassurance given regarding her BPs She will schedule an annual exam for pap smear within the next 6 months.

## 2016-03-02 IMAGING — US US MFM OB DETAIL+14 WK
1 series · 13 of 28 positions shown · non-contrast
Comparison: none

pm)

Name:       SIMPY JAKUPI                        Visit  02/13/2015 [DATE]
Date:
Faculty Physician
Attending:         Norge Montes       Address:           [address]
[place]                                       [place]
[address]            [id number]       [id number]     [id number]
Indications
Basic anatomic survey                           Z36
18 weeks gestation of pregnancy
Fetal abnormality - other known or
suspected (i.e. choriod plexus cyst, EIF)
OB History
Gravidity:     1         Term:  0        Prem:    0        SAB:   0
TOP:           0       Ectopic  0        Living:  0
:
Fetal Evaluation
Num Of Fetuses:      1
Fetal Heart          141
Rate(bpm):
Cardiac Activity:    Observed
Presentation:        Cephalic
Placenta:            Anterior, above cervical os
P. Cord Insertion:   Visualized, central
Amniotic Fluid
AFI FV:      Subjectively within normal limits
Larg Pckt:      4.9  cm
Biometry
BPD:      42.5  mm     G. Age:   18w 6d                  CI:        76.98   %    70 - 86
FL/HC:      17.9   %    15.8 - 18
HC:      153.4  mm     G. Age:   18w 2d        52   %    HC/AC:      1.13        1.07 -
AC:      136.2  mm     G. Age:   19w 0d        77   %    FL/BPD      64.7   %
FL:       27.5  mm     G. Age:   18w 3d        54   %    FL/AC:      20.2   %    20 - 24
HUM:      29.2  mm     G. Age:   19w 4d        92   %
Est.         254   gm    0 lb 9 oz      58   %
FW:
Gestational Age
LMP:           18w 5d        Date:  10/05/14                  EDD:   07/12/15
U/S Today:     18w 5d                                         EDD:   07/12/15
Best:          18w 1d    Det. By:   Early Ultrasound          EDD:   07/16/15
(12/13/14)
Anatomy
Cranium:          Appears normal         Aortic Arch:       Not well visualized
Fetal Cavum:      Appears normal         Ductal Arch:       Not well visualized
Ventricles:       Appears normal         Diaphragm:         Appears normal
Choroid Plexus:   Left choroid           Stomach:           Appears normal,
plexus cyst, 2                            left sided
mm
Cerebellum:       Appears normal         Abdomen:           Appears normal
Posterior         Appears normal         Abdominal          Appears nml (cord
Fossa:                                   Wall:              insert, abd wall)
Nuchal Fold:      Appears normal         Cord Vessels:      Appears normal (3
vessel cord)
Face:             Orbits nl; profile     Kidneys:           Not well visualized
not well visualized
Lips:             Not well visualized    Bladder:           Appears normal
Fetal Thoracic:   Appears normal         Spine:             Appears normal
Heart:            Echogenic focus        Upper              Appears normal
in LV                  Extremities:
RVOT:             Not well visualized    Lower              Appears normal
Extremities:
LVOT:             Appears normal
Other:   Heels and 5th digit visualized. Fetus appears to be a female.
Parents do not wish to know sex of fetus. Nasal bone visualized.
Technically difficult due to fetal position.
Cervix Uterus Adnexa
Cervix
Length:             3.3  cm.
Normal appearance by transabdominal scan.
Left Ovary
Within normal limits. No adnexal mass visualized.
Right Ovary
Not visualized. No adnexal mass visualized.
Cul De        No free fluid seen.
Sac:
Impression
INDICATION: 25 yr old G1P0 at 89w8d for fetal anatomic
survey. Remote read.

[Series 1: us mfm ob detail+14 wk · 81 acquisitions, 13 frames shown]
[im 3/81]
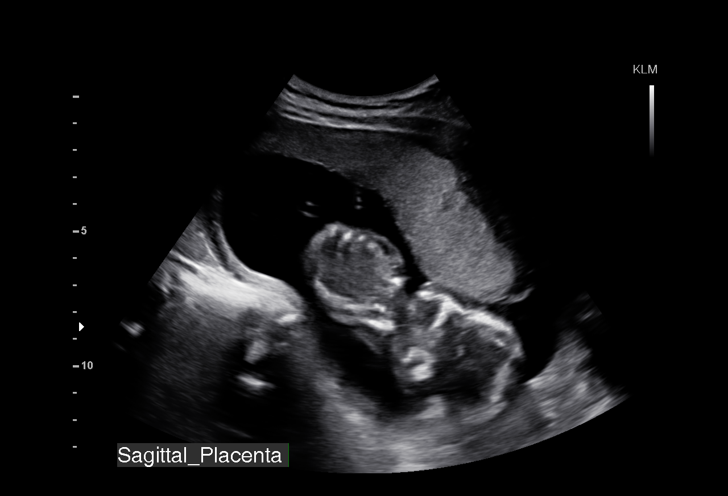
[im 9/81]
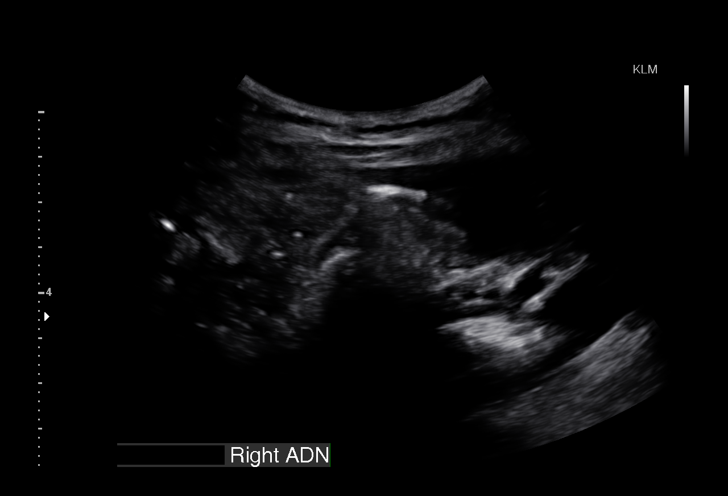
[im 15/81]
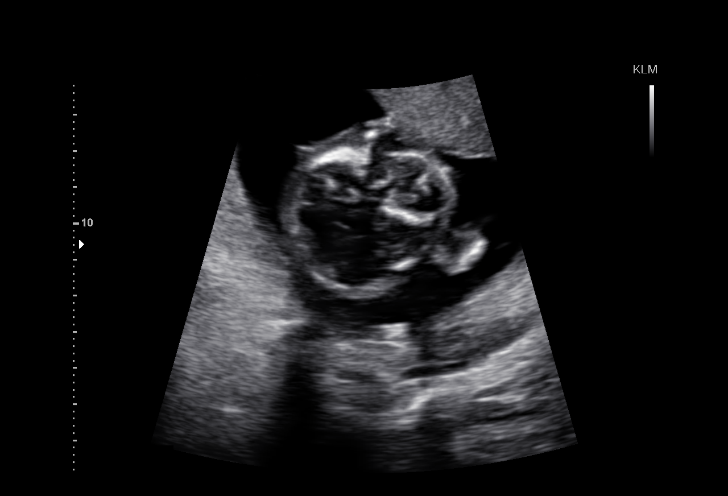
[im 21/81]
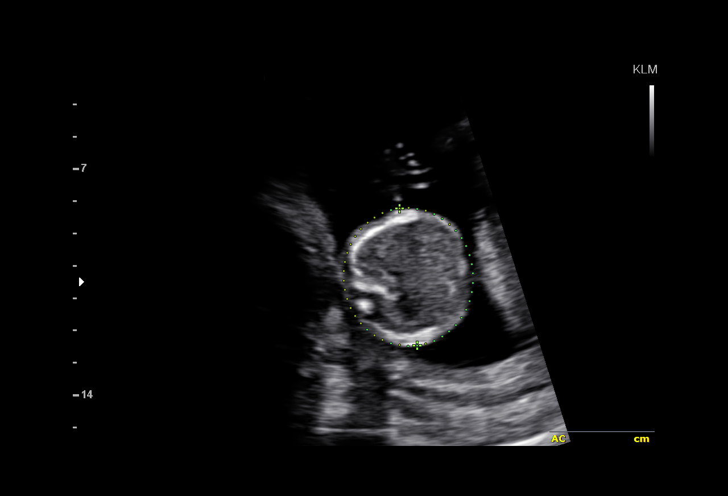
[im 27/81]
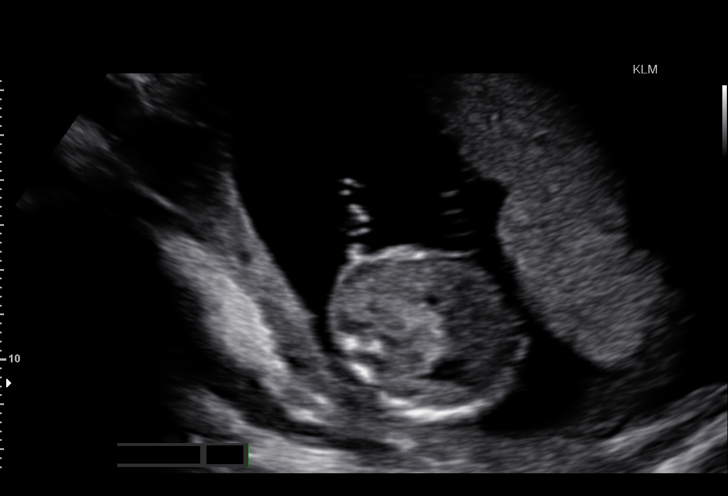
[im 33/81]
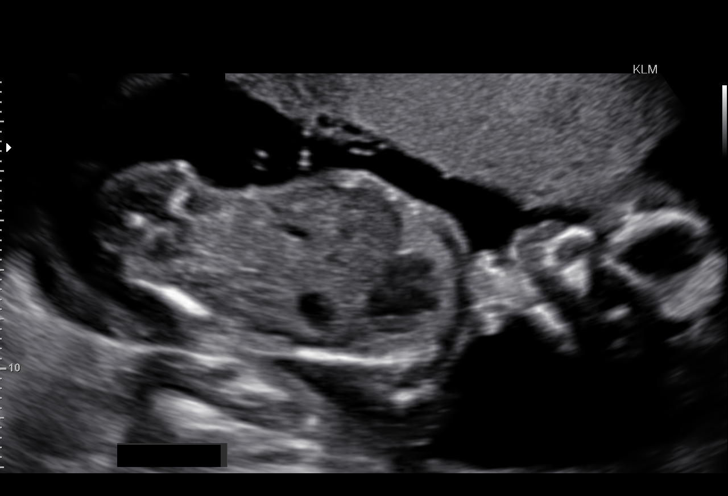
[im 42/81]
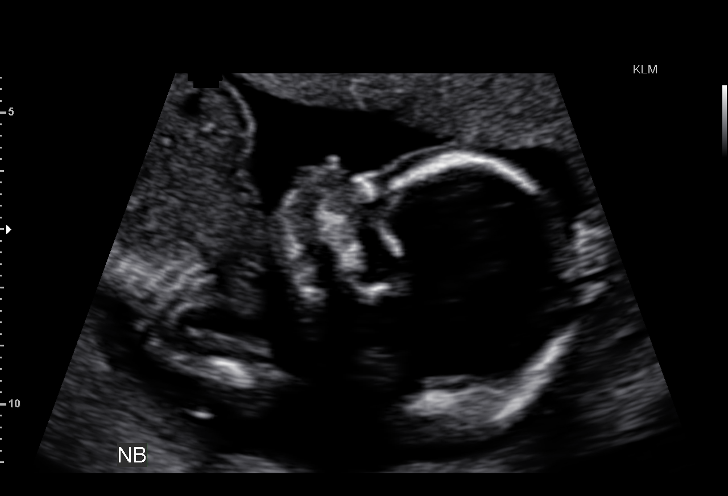
[im 48/81]
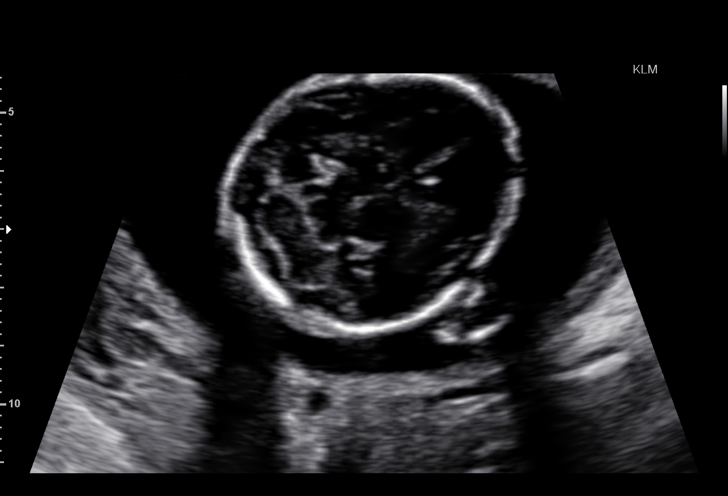
[im 54/81]
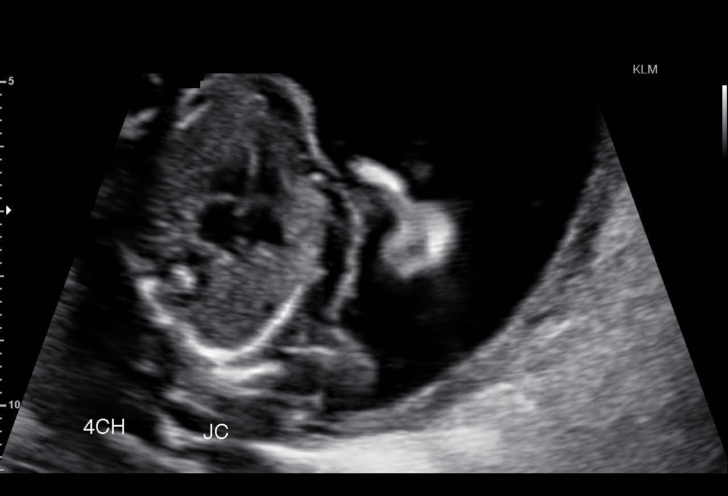
[im 60/81]
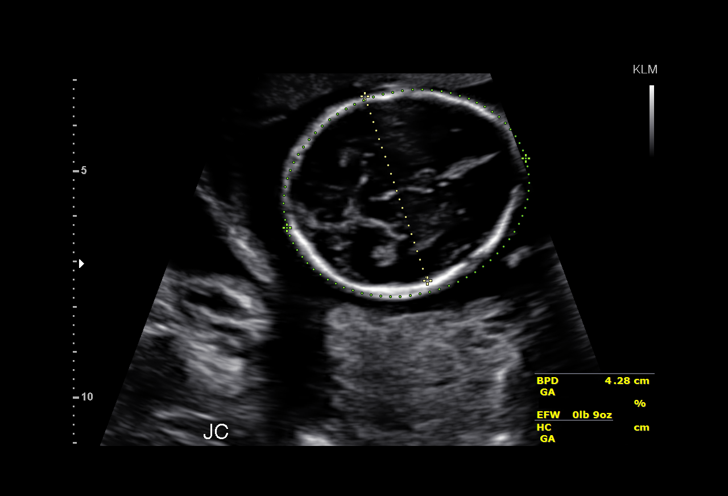
[im 66/81]
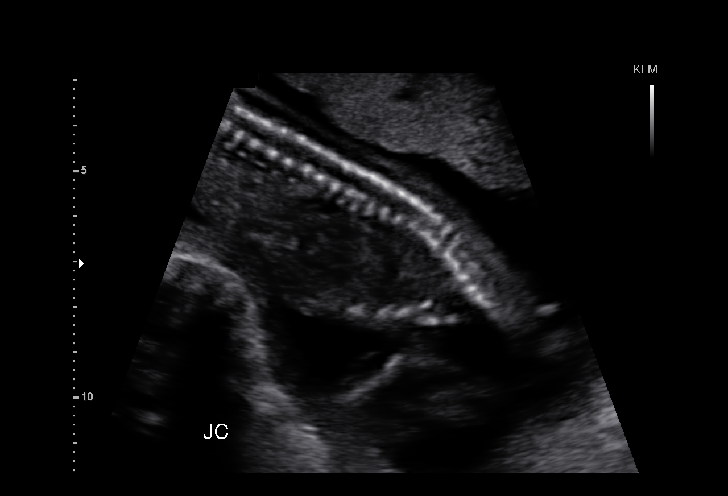
[im 72/81]
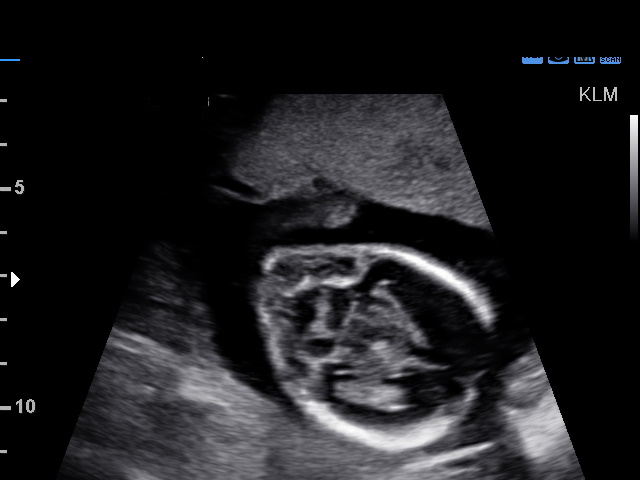
[im 78/81]
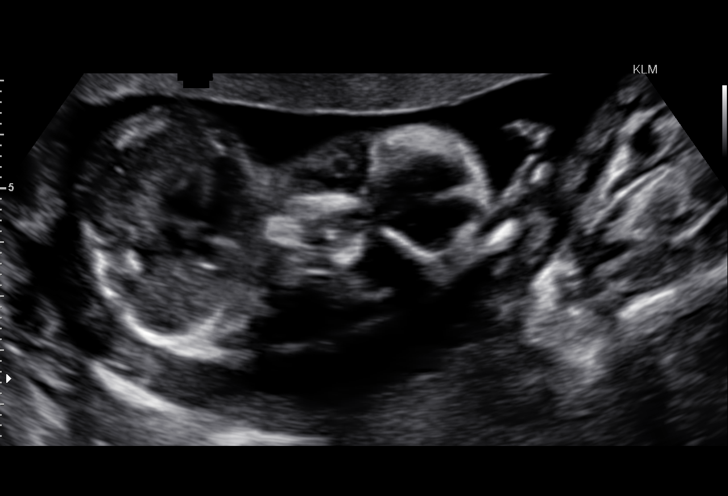

[13 of 28 positions shown; findings below may reference images not displayed]

FINDINGS: 1. Single intrauterine pregnancy.
2. Fetal biometry is consistent with dating.
3. Anterior placenta without evidence of previa.
4. Normal amniotic fluid volume.
5. Normal transabdominal cervical length.
6. There is a choroid plexus cyst on the left.
7. There is an echogenic focus in the left ventricle.
8. The views of the nose/lips, profile/nasal bone, heart, and
kidneys are limited.
9. The remainder of the limited anatomy survey is normal.
Recommendations

1. Appropriate fetal growth.
2. Limited anatomy survey:
- recommend follow up in 3-4 weeks to complete anatomic
survey
3. Choroid plexus cyst/ echogenic focus in the left ventricle:
- patient not counseled as this is a remote read
- recommend refer to genetic counseling
- recommend offer aneuploidy screening

## 2017-01-14 NOTE — L&D Delivery Note (Addendum)
Patient: Ariel ManifoldJessica Gault MRN: [id number]  GBS status: positive, IAP given X 2 doses  Patient is a 27 y.o. now [id number] s/p NSVD at 2924w1d, who was admitted for elective IOL. SROM 0h 3885m prior to delivery with light mec stained fluid.    Delivery Note At 1:38 PM a viable female was delivered via Vaginal, Spontaneous (Presentation: LOA). Compound presentation Lt arm. APGAR: 9, 9; weight pending .   Placenta status: spontaneous, intact .  Cord: 3 vessel   Anesthesia:  Epidural  Episiotomy: None Lacerations:  2nd degree Suture Repair: 2-0 vicryl rapide Est. Blood Loss (mL): 130  Mom to postpartum.  Baby to Couplet care / Skin to Skin.  Sigurd SosStephenia M Deloglos 01/12/2018, 2:08 PM   Head delivered LOA. No nuchal cord present. Compound presentation Lt arm. Shoulder and body delivered in usual fashion. Infant with spontaneous cry, placed on mother's abdomen, dried and bulb suctioned. Cord clamped x 2 after 1-minute delay, and cut by family member. Cord blood drawn. Placenta delivered spontaneously with gentle cord traction. Fundus firm with massage and Pitocin. Perineum inspected and found to have second degree laceration, which was repaired with a 2-0 vicryl rapide with good hemostasis achieved.  Midwife attestation: I was gloved and present for delivery in its entirety and I agree with the above resident's note.  Donette LarryMelanie Eliabeth Shoff, CNM 2:22 PM

## 2017-05-23 ENCOUNTER — Emergency Department
Admission: EM | Admit: 2017-05-23 | Discharge: 2017-05-23 | Discharge status: Absent without leave | Payer: 59 | Source: Home / Self Care

## 2017-06-19 ENCOUNTER — Ambulatory Visit (INDEPENDENT_AMBULATORY_CARE_PROVIDER_SITE_OTHER): Payer: 59 | Admitting: Obstetrics & Gynecology

## 2017-06-19 ENCOUNTER — Encounter: Payer: Self-pay | Admitting: Obstetrics & Gynecology

## 2017-06-19 DIAGNOSIS — Z3481 Encounter for supervision of other normal pregnancy, first trimester: Secondary | ICD-10-CM

## 2017-06-19 DIAGNOSIS — Z3687 Encounter for antenatal screening for uncertain dates: Secondary | ICD-10-CM | POA: Diagnosis not present

## 2017-06-19 DIAGNOSIS — Z3491 Encounter for supervision of normal pregnancy, unspecified, first trimester: Secondary | ICD-10-CM

## 2017-06-19 DIAGNOSIS — Z113 Encounter for screening for infections with a predominantly sexual mode of transmission: Secondary | ICD-10-CM

## 2017-06-19 DIAGNOSIS — Z8759 Personal history of other complications of pregnancy, childbirth and the puerperium: Secondary | ICD-10-CM

## 2017-06-19 DIAGNOSIS — Z3490 Encounter for supervision of normal pregnancy, unspecified, unspecified trimester: Secondary | ICD-10-CM | POA: Insufficient documentation

## 2017-06-19 NOTE — Progress Notes (Signed)
Bedside U/S shows single IUP with FHt of 168 BPM and CRL is 30.5.

## 2017-06-19 NOTE — Progress Notes (Signed)
  Subjective:    Ariel ManifoldJessica Andrews is being seen today for her first obstetrical visit. She is at 4310w4d gestation. Her obstetrical history is significant for pre-eclampsia. Relationship with FOB: spouse, living together. Patient is not sure about her intention to  intend to breast feed. Pregnancy history fully reviewed.  Patient reports no complaints.  Review of Systems:   Review of Systems  Objective:     BP 118/81   Pulse (!) 102   Wt 143 lb (64.9 kg)   LMP 04/03/2017   BMI 24.55 kg/m  Physical Exam  Exam    Assessment:    Pregnancy: G2P1001 Patient Active Problem List   Diagnosis Date Noted  . Supervision of low-risk pregnancy 06/19/2017  . History of pre-eclampsia 06/19/2017  . Mild preeclampsia 07/04/2015  . Thrombocytopenia affecting pregnancy (HCC) 05/06/2015  . 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 Anxiety state 11/09/2014       Plan:     Initial labs drawn. Prenatal vitamins. Problem list reviewed and updated. AFP3 discussed: declined. Role of ultrasound in pregnancy discussed; fetal survey: requested. Amniocentesis discussed: not indicated. Follow up in 2 weeks. She declines a pap smear, aware of ACOG recs She is uncertain about Baby scripts optomized schedule Rec baby asa form 13 to [redacted] weeks EGA   Ariel Andrews C Ariel Andrews 06/19/2017

## 2017-06-20 LAB — OBSTETRIC PANEL
ANTIBODY SCREEN: NOT DETECTED
BASOS PCT: 0.6 %
Basophils Absolute: 49 cells/uL (ref 0–200)
EOS ABS: 73 {cells}/uL (ref 15–500)
Eosinophils Relative: 0.9 %
HEMATOCRIT: 39.4 % (ref 35.0–45.0)
HEMOGLOBIN: 13.6 g/dL (ref 11.7–15.5)
Hepatitis B Surface Ag: NONREACTIVE
Lymphs Abs: 1337 cells/uL (ref 850–3900)
MCH: 28.8 pg (ref 27.0–33.0)
MCHC: 34.5 g/dL (ref 32.0–36.0)
MCV: 83.3 fL (ref 80.0–100.0)
MONOS PCT: 5.7 %
MPV: 11.6 fL (ref 7.5–12.5)
NEUTROS ABS: 6180 {cells}/uL (ref 1500–7800)
Neutrophils Relative %: 76.3 %
Platelets: 205 10*3/uL (ref 140–400)
RBC: 4.73 10*6/uL (ref 3.80–5.10)
RDW: 13.9 % (ref 11.0–15.0)
RPR: NONREACTIVE
Rubella: 3.22 index
TOTAL LYMPHOCYTE: 16.5 %
WBC: 8.1 10*3/uL (ref 3.8–10.8)
WBCMIX: 462 {cells}/uL (ref 200–950)

## 2017-06-20 LAB — GC/CHLAMYDIA PROBE AMP (~~LOC~~) NOT AT ARMC
CHLAMYDIA, DNA PROBE: NEGATIVE
Neisseria Gonorrhea: NEGATIVE

## 2017-06-20 LAB — HIV ANTIBODY (ROUTINE TESTING W REFLEX): HIV 1&2 Ab, 4th Generation: NONREACTIVE

## 2017-06-22 LAB — URINE CULTURE, OB REFLEX: Organism ID, Bacteria: NO GROWTH

## 2017-06-22 LAB — CULTURE, OB URINE

## 2017-06-23 ENCOUNTER — Other Ambulatory Visit: Payer: Self-pay | Admitting: *Deleted

## 2017-06-23 MED ORDER — PRENATAL VITAMIN PLUS LOW IRON 27-1 MG PO TABS: 1.0000 | ORAL_TABLET | Freq: Every day | ORAL | 12 refills | Status: DC

## 2017-07-01 ENCOUNTER — Ambulatory Visit (INDEPENDENT_AMBULATORY_CARE_PROVIDER_SITE_OTHER): Payer: 59 | Admitting: Obstetrics & Gynecology

## 2017-07-01 VITALS — BP 119/74 | HR 94 | Wt 145.0 lb

## 2017-07-01 DIAGNOSIS — O99119 Other diseases of the blood and blood-forming organs and certain disorders involving the immune mechanism complicating pregnancy, unspecified trimester: Principal | ICD-10-CM

## 2017-07-01 DIAGNOSIS — Z3492 Encounter for supervision of normal pregnancy, unspecified, second trimester: Secondary | ICD-10-CM

## 2017-07-01 DIAGNOSIS — O99112 Other diseases of the blood and blood-forming organs and certain disorders involving the immune mechanism complicating pregnancy, second trimester: Secondary | ICD-10-CM

## 2017-07-01 DIAGNOSIS — Z3491 Encounter for supervision of normal pregnancy, unspecified, first trimester: Secondary | ICD-10-CM

## 2017-07-01 DIAGNOSIS — D696 Thrombocytopenia, unspecified: Secondary | ICD-10-CM

## 2017-07-01 NOTE — Progress Notes (Signed)
   PRENATAL VISIT NOTE  Subjective:  Ariel Andrews is a 27 y.o. G2P1001 at 4493w2d being seen today for ongoing prenatal care.  She is currently monitored for the following issues for this low-risk pregnancy and has Anxiety state; Psychogenic vaginismus; Supervision of low-risk pregnancy; and History of pre-eclampsia on their problem list.  Patient reports no complaints.   . Vag. Bleeding: None.   . Denies leaking of fluid.   The following portions of the patient's history were reviewed and updated as appropriate: allergies, current medications, past family history, past medical history, past social history, past surgical history and problem list. Problem list updated.  Objective:   Vitals:   07/01/17 1545  BP: 119/74  Pulse: 94  Weight: 145 lb (65.8 kg)    Fetal Status: Fetal Heart Rate (bpm): 161         General:  Alert, oriented and cooperative. Patient is in no acute distress.  Skin: Skin is warm and dry. No rash noted.   Cardiovascular: Normal heart rate noted  Respiratory: Normal respiratory effort, no problems with respiration noted  Abdomen: Soft, gravid, appropriate for gestational age.  Pain/Pressure: Absent     Pelvic: Cervical exam deferred        Extremities: Normal range of motion.  Edema: None  Mental Status: Normal mood and affect. Normal behavior. Normal judgment and thought content.   Assessment and Plan:  Pregnancy: G2P1001 at 9893w2d  1. Encounter for supervision of low-risk pregnancy in first trimester  - She has decided that she would like NIPS but she wants to call her insurance company before we draw it. - She declines optimized scheduling   Preterm labor symptoms and general obstetric precautions including but not limited to vaginal bleeding, contractions, leaking of fluid and fetal movement were reviewed in detail with the patient. Please refer to After Visit Summary for other counseling recommendations.  No follow-ups on file.  No future  appointments.  Allie BossierMyra C Julie-Anne Torain, MD

## 2017-07-08 ENCOUNTER — Other Ambulatory Visit: Payer: 59

## 2017-07-08 DIAGNOSIS — Z3482 Encounter for supervision of other normal pregnancy, second trimester: Secondary | ICD-10-CM

## 2017-07-14 ENCOUNTER — Telehealth: Payer: Self-pay

## 2017-07-14 DIAGNOSIS — Z3491 Encounter for supervision of normal pregnancy, unspecified, first trimester: Secondary | ICD-10-CM

## 2017-07-14 NOTE — Telephone Encounter (Signed)
Spoke with pt and she is aware that her Cindy Hazyanorama results are low risk and the fetal sex is female.

## 2017-07-29 ENCOUNTER — Ambulatory Visit (INDEPENDENT_AMBULATORY_CARE_PROVIDER_SITE_OTHER): Payer: 59 | Admitting: Obstetrics & Gynecology

## 2017-07-29 VITALS — BP 114/66 | HR 97 | Wt 151.0 lb

## 2017-07-29 DIAGNOSIS — Z3480 Encounter for supervision of other normal pregnancy, unspecified trimester: Secondary | ICD-10-CM

## 2017-07-29 DIAGNOSIS — Z8759 Personal history of other complications of pregnancy, childbirth and the puerperium: Secondary | ICD-10-CM

## 2017-07-29 DIAGNOSIS — Z3482 Encounter for supervision of other normal pregnancy, second trimester: Secondary | ICD-10-CM | POA: Diagnosis not present

## 2017-07-29 DIAGNOSIS — Z3492 Encounter for supervision of normal pregnancy, unspecified, second trimester: Secondary | ICD-10-CM

## 2017-07-29 NOTE — Progress Notes (Addendum)
   PRENATAL VISIT NOTE  Subjective:  Ariel ManifoldJessica Andrews is a 27 y.o. G2P1001 at 9370w2d being seen today for ongoing prenatal care.  She is currently monitored for the following issues for this low-risk pregnancy and has Anxiety state; Psychogenic vaginismus; Supervision of low-risk pregnancy; and History of pre-eclampsia on their problem list.  Patient reports no complaints.   . Vag. Bleeding: None.  Movement: Absent. Denies leaking of fluid.   The following portions of the patient's history were reviewed and updated as appropriate: allergies, current medications, past family history, past medical history, past social history, past surgical history and problem list. Problem list updated.  Objective:   Vitals:   07/29/17 1334  BP: 114/66  Pulse: 97  Weight: 151 lb (68.5 kg)    Fetal Status: Fetal Heart Rate (bpm): 155   Movement: Absent     General:  Alert, oriented and cooperative. Patient is in no acute distress.  Skin: Skin is warm and dry. No rash noted.   Cardiovascular: Normal heart rate noted  Respiratory: Normal respiratory effort, no problems with respiration noted  Abdomen: Soft, gravid, appropriate for gestational age.  Pain/Pressure: Absent     Pelvic: Cervical exam deferred        Extremities: Normal range of motion.  Edema: None  Mental Status: Normal mood and affect. Normal behavior. Normal judgment and thought content.   Assessment and Plan:  Pregnancy: G2P1001 at 6070w2d  1. Supervision of other normal pregnancy, antepartum - Alpha fetoprotein, maternal  2. History of pre-eclampsia - rec'd baby asa, starting now or never  3. Encounter for supervision of low-risk pregnancy in second trimester   Preterm labor symptoms and general obstetric precautions including but not limited to vaginal bleeding, contractions, leaking of fluid and fetal movement were reviewed in detail with the patient. Please refer to After Visit Summary for other counseling recommendations.  No  follow-ups on file.  Future Appointments  Date Time Provider Department Center  08/19/2017 10:00 AM [facility] US 3 [facility]    Allie BossierMyra C Ardon Franklin, MD

## 2017-07-30 LAB — ALPHA FETOPROTEIN, MATERNAL
AFP MOM: 0.62
AFP, SERUM: 21.4 ng/mL
Calc'd Gestational Age: 16.3 weeks
MATERNAL WT: 145 [lb_av]
Twins-AFP: 1

## 2017-08-12 ENCOUNTER — Encounter (HOSPITAL_COMMUNITY): Payer: Self-pay

## 2017-08-19 ENCOUNTER — Ambulatory Visit (HOSPITAL_COMMUNITY)
Admission: RE | Admit: 2017-08-19 | Discharge: 2017-08-19 | Disposition: A | Discharge status: Home / Self Care | Payer: 59 | Source: Ambulatory Visit | Attending: Obstetrics & Gynecology | Admitting: Obstetrics & Gynecology

## 2017-08-19 ENCOUNTER — Other Ambulatory Visit: Payer: Self-pay | Admitting: Obstetrics & Gynecology

## 2017-08-19 ENCOUNTER — Other Ambulatory Visit (HOSPITAL_COMMUNITY): Payer: Self-pay | Admitting: *Deleted

## 2017-08-19 DIAGNOSIS — O09299 Supervision of pregnancy with other poor reproductive or obstetric history, unspecified trimester: Secondary | ICD-10-CM

## 2017-08-19 DIAGNOSIS — Z3A19 19 weeks gestation of pregnancy: Secondary | ICD-10-CM

## 2017-08-19 DIAGNOSIS — D696 Thrombocytopenia, unspecified: Secondary | ICD-10-CM

## 2017-08-19 DIAGNOSIS — Z3491 Encounter for supervision of normal pregnancy, unspecified, first trimester: Secondary | ICD-10-CM

## 2017-08-19 DIAGNOSIS — O09292 Supervision of pregnancy with other poor reproductive or obstetric history, second trimester: Secondary | ICD-10-CM

## 2017-08-19 DIAGNOSIS — O99119 Other diseases of the blood and blood-forming organs and certain disorders involving the immune mechanism complicating pregnancy, unspecified trimester: Secondary | ICD-10-CM

## 2017-08-19 DIAGNOSIS — O99112 Other diseases of the blood and blood-forming organs and certain disorders involving the immune mechanism complicating pregnancy, second trimester: Secondary | ICD-10-CM

## 2017-08-19 DIAGNOSIS — Z363 Encounter for antenatal screening for malformations: Secondary | ICD-10-CM

## 2017-08-19 DIAGNOSIS — Z362 Encounter for other antenatal screening follow-up: Secondary | ICD-10-CM

## 2017-08-26 ENCOUNTER — Ambulatory Visit (INDEPENDENT_AMBULATORY_CARE_PROVIDER_SITE_OTHER): Payer: 59 | Admitting: Obstetrics & Gynecology

## 2017-08-26 DIAGNOSIS — Z3492 Encounter for supervision of normal pregnancy, unspecified, second trimester: Secondary | ICD-10-CM

## 2017-08-26 DIAGNOSIS — Z3482 Encounter for supervision of other normal pregnancy, second trimester: Secondary | ICD-10-CM

## 2017-08-26 NOTE — Progress Notes (Signed)
   PRENATAL VISIT NOTE  Subjective:  Ariel Andrews is a 89[name] y.o. G2P1001 at 4929w2d being seen today for ongoing prenatal care.  She is currently monitored for the following issues for this low-risk pregnancy and has Anxiety state; Psychogenic vaginismus; Supervision of low-risk pregnancy; and History of pre-eclampsia on their problem list.  Patient reports no complaints.   . Vag. Bleeding: None.  Movement: Present. Denies leaking of fluid.   The following portions of the patient's history were reviewed and updated as appropriate: allergies, current medications, past family history, past medical history, past social history, past surgical history and problem list. Problem list updated.  Objective:   Vitals:   08/26/17 1611  BP: 115/66  Pulse: 80  Weight: 154 lb (69.9 kg)    Fetal Status: Fetal Heart Rate (bpm): 156   Movement: Present     General:  Alert, oriented and cooperative. Patient is in no acute distress.  Skin: Skin is warm and dry. No rash noted.   Cardiovascular: Normal heart rate noted  Respiratory: Normal respiratory effort, no problems with respiration noted  Abdomen: Soft, gravid, appropriate for gestational age.  Pain/Pressure: Absent     Pelvic: Cervical exam performed        Extremities: Normal range of motion.  Edema: None  Mental Status: Normal mood and affect. Normal behavior. Normal judgment and thought content.   Assessment and Plan:  Pregnancy: G2P1001 at 8929w2d  1. Encounter for supervision of low-risk pregnancy in second trimester -pt advised to take baby asa; hesitant to take meds; long discussion about lack of natural -exercise is fine--choose things that don't put her at risk for falling -weight gain 25-35 pounds goal   Preterm labor symptoms and general obstetric precautions including but not limited to vaginal bleeding, contractions, leaking of fluid and fetal movement were reviewed in detail with the patient. Please refer to After Visit Summary for  other counseling recommendations.  Return in about 4 weeks (around 09/23/2017).  Future Appointments  Date Time Provider Department Center  09/16/2017  3:45 PM [facility] US 2 [facility]    Elsie LincolnKelly Kyira Volkert, MD

## 2017-09-16 ENCOUNTER — Ambulatory Visit (HOSPITAL_COMMUNITY)
Admission: RE | Admit: 2017-09-16 | Discharge: 2017-09-16 | Disposition: A | Discharge status: Home / Self Care | Payer: 59 | Source: Ambulatory Visit | Attending: Obstetrics & Gynecology | Admitting: Obstetrics & Gynecology

## 2017-09-16 DIAGNOSIS — O99113 Other diseases of the blood and blood-forming organs and certain disorders involving the immune mechanism complicating pregnancy, third trimester: Secondary | ICD-10-CM | POA: Diagnosis not present

## 2017-09-16 DIAGNOSIS — D696 Thrombocytopenia, unspecified: Secondary | ICD-10-CM

## 2017-09-16 DIAGNOSIS — Z362 Encounter for other antenatal screening follow-up: Secondary | ICD-10-CM | POA: Insufficient documentation

## 2017-09-16 DIAGNOSIS — Z3A23 23 weeks gestation of pregnancy: Secondary | ICD-10-CM | POA: Diagnosis not present

## 2017-09-16 DIAGNOSIS — O09293 Supervision of pregnancy with other poor reproductive or obstetric history, third trimester: Secondary | ICD-10-CM

## 2017-09-23 ENCOUNTER — Ambulatory Visit (INDEPENDENT_AMBULATORY_CARE_PROVIDER_SITE_OTHER): Payer: 59 | Admitting: Obstetrics & Gynecology

## 2017-09-23 VITALS — BP 122/75 | HR 90 | Wt 163.0 lb

## 2017-09-23 DIAGNOSIS — Z3492 Encounter for supervision of normal pregnancy, unspecified, second trimester: Secondary | ICD-10-CM

## 2017-09-23 DIAGNOSIS — Z3482 Encounter for supervision of other normal pregnancy, second trimester: Secondary | ICD-10-CM

## 2017-09-23 DIAGNOSIS — Z23 Encounter for immunization: Secondary | ICD-10-CM

## 2017-09-23 DIAGNOSIS — Z8759 Personal history of other complications of pregnancy, childbirth and the puerperium: Secondary | ICD-10-CM

## 2017-09-23 NOTE — Progress Notes (Signed)
   PRENATAL VISIT NOTE  Subjective:  Ariel Andrews is a 27 y.o. G2P1001 at [redacted]w[redacted]d being seen today for ongoing prenatal care.  She is currently monitored for the following issues for this low-risk pregnancy and has Anxiety state; Psychogenic vaginismus; Supervision of low-risk pregnancy; and History of pre-eclampsia on their problem list.  Patient reports no complaints.   . Vag. Bleeding: None.  Movement: Present. Denies leaking of fluid.   The following portions of the patient's history were reviewed and updated as appropriate: allergies, current medications, past family history, past medical history, past social history, past surgical history and problem list. Problem list updated.  Objective:   Vitals:   09/23/17 1609  BP: 122/75  Pulse: 90  Weight: 163 lb (73.9 kg)    Fetal Status:     Movement: Present     General:  Alert, oriented and cooperative. Patient is in no acute distress.  Skin: Skin is warm and dry. No rash noted.   Cardiovascular: Normal heart rate noted  Respiratory: Normal respiratory effort, no problems with respiration noted  Abdomen: Soft, gravid, appropriate for gestational age.  Pain/Pressure: Absent     Pelvic: Cervical exam deferred        Extremities: Normal range of motion.  Edema: None  Mental Status: Normal mood and affect. Normal behavior. Normal judgment and thought content.   Assessment and Plan:  Pregnancy: G2P1001 at [redacted]w[redacted]d  1. Immunization due  - Flu Vaccine QUAD 36+ mos IM (Fluarix, Quad PF)  2. Encounter for supervision of low-risk pregnancy in second trimester   3. History of pre-eclampsia - she declines baby asa  Preterm labor symptoms and general obstetric precautions including but not limited to vaginal bleeding, contractions, leaking of fluid and fetal movement were reviewed in detail with the patient. Please refer to After Visit Summary for other counseling recommendations.  No follow-ups on file.  No future  appointments.  Allie Bossier, MD

## 2017-10-20 ENCOUNTER — Ambulatory Visit (INDEPENDENT_AMBULATORY_CARE_PROVIDER_SITE_OTHER): Payer: 59 | Admitting: Obstetrics & Gynecology

## 2017-10-20 VITALS — BP 116/69 | HR 105 | Wt 166.0 lb

## 2017-10-20 DIAGNOSIS — Z3492 Encounter for supervision of normal pregnancy, unspecified, second trimester: Secondary | ICD-10-CM

## 2017-10-20 DIAGNOSIS — Z8759 Personal history of other complications of pregnancy, childbirth and the puerperium: Secondary | ICD-10-CM

## 2017-10-20 NOTE — Progress Notes (Signed)
   PRENATAL VISIT NOTE  Subjective:  Ariel Andrews is a 27 y.o. G2P1001 at [redacted]w[redacted]d being seen today for ongoing prenatal care.  She is currently monitored for the following issues for this low-risk pregnancy and has Anxiety state; Psychogenic vaginismus; Supervision of low-risk pregnancy; and History of pre-eclampsia on their problem list.  Patient reports no complaints.   .  .   . Denies leaking of fluid.   The following portions of the patient's history were reviewed and updated as appropriate: allergies, current medications, past family history, past medical history, past social history, past surgical history and problem list. Problem list updated.  Objective:  There were no vitals filed for this visit.  Fetal Status:           General:  Alert, oriented and cooperative. Patient is in no acute distress.  Skin: Skin is warm and dry. No rash noted.   Cardiovascular: Normal heart rate noted  Respiratory: Normal respiratory effort, no problems with respiration noted  Abdomen: Soft, gravid, appropriate for gestational age.        Pelvic: Cervical exam deferred        Extremities: Normal range of motion.     Mental Status: Normal mood and affect. Normal behavior. Normal judgment and thought content.   Assessment and Plan:  Pregnancy: G2P1001 at [redacted]w[redacted]d  1. Encounter for supervision of low-risk pregnancy in second trimester  - 2Hr GTT w/ 1 Hr Carpenter 75 g - CBC - HIV antibody (with reflex) - RPR - currently declines TDAP  2. History of pre-eclampsia - baby asa daily  Preterm labor symptoms and general obstetric precautions including but not limited to vaginal bleeding, contractions, leaking of fluid and fetal movement were reviewed in detail with the patient. Please refer to After Visit Summary for other counseling recommendations.  Return in about 3 weeks (around 11/10/2017).  Future Appointments  Date Time Provider Department Center  10/20/2017  8:15 AM Allie Bossier, MD  [facility]    Allie Bossier, MD

## 2017-10-21 ENCOUNTER — Encounter: Payer: 59 | Admitting: Advanced Practice Midwife

## 2017-10-21 LAB — CBC
HCT: 35.5 % (ref 35.0–45.0)
Hemoglobin: 12.3 g/dL (ref 11.7–15.5)
MCH: 30.8 pg (ref 27.0–33.0)
MCHC: 34.6 g/dL (ref 32.0–36.0)
MCV: 89 fL (ref 80.0–100.0)
MPV: 12.5 fL (ref 7.5–12.5)
PLATELETS: 150 10*3/uL (ref 140–400)
RBC: 3.99 10*6/uL (ref 3.80–5.10)
RDW: 12.7 % (ref 11.0–15.0)
WBC: 13.2 10*3/uL — AB (ref 3.8–10.8)

## 2017-10-21 LAB — RPR: RPR Ser Ql: NONREACTIVE

## 2017-10-21 LAB — 2HR GTT W 1 HR, CARPENTER, 75 G
Glucose, 1 Hr, Gest: 97 mg/dL (ref 65–179)
Glucose, 2 Hr, Gest: 76 mg/dL (ref 65–152)
Glucose, Fasting, Gest: 77 mg/dL (ref 65–91)

## 2017-10-21 LAB — HIV ANTIBODY (ROUTINE TESTING W REFLEX): HIV 1&2 Ab, 4th Generation: NONREACTIVE

## 2017-11-10 ENCOUNTER — Ambulatory Visit (INDEPENDENT_AMBULATORY_CARE_PROVIDER_SITE_OTHER): Payer: 59 | Admitting: Obstetrics & Gynecology

## 2017-11-10 VITALS — BP 116/71 | HR 91 | Wt 173.0 lb

## 2017-11-10 DIAGNOSIS — Z3483 Encounter for supervision of other normal pregnancy, third trimester: Secondary | ICD-10-CM

## 2017-11-10 DIAGNOSIS — Z3480 Encounter for supervision of other normal pregnancy, unspecified trimester: Secondary | ICD-10-CM

## 2017-11-10 DIAGNOSIS — Z23 Encounter for immunization: Secondary | ICD-10-CM | POA: Diagnosis not present

## 2017-11-10 DIAGNOSIS — Z3492 Encounter for supervision of normal pregnancy, unspecified, second trimester: Secondary | ICD-10-CM

## 2017-11-10 NOTE — Progress Notes (Signed)
   PRENATAL VISIT NOTE  Subjective:  Ariel Andrews is a 27 y.o. G2P1001 at [redacted]w[redacted]d being seen today for ongoing prenatal care.  She is currently monitored for the following issues for this low-risk pregnancy and has Anxiety state; Psychogenic vaginismus; Supervision of low-risk pregnancy; and History of pre-eclampsia on their problem list.  Patient reports upper abdomen pain today when baby is stretching, constant.  Contractions: Not present. Vag. Bleeding: None.  Movement: Present. Denies leaking of fluid.   The following portions of the patient's history were reviewed and updated as appropriate: allergies, current medications, past family history, past medical history, past social history, past surgical history and problem list. Problem list updated.  Objective:   Vitals:   11/10/17 1620  BP: 116/71  Pulse: 91  Weight: 173 lb (78.5 kg)    Fetal Status: Fetal Heart Rate (bpm): 141   Movement: Present     General:  Alert, oriented and cooperative. Patient is in no acute distress.  Skin: Skin is warm and dry. No rash noted.   Cardiovascular: Normal heart rate noted  Respiratory: Normal respiratory effort, no problems with respiration noted  Abdomen: Soft, gravid, appropriate for gestational age.  Pain/Pressure: Absent     Pelvic: Cervical exam deferred        Extremities: Normal range of motion.  Edema: None  Mental Status: Normal mood and affect. Normal behavior. Normal judgment and thought content.   Assessment and Plan:  Pregnancy: G2P1001 at [redacted]w[redacted]d  1. Encounter for supervision of low-risk pregnancy in second trimester   2. Supervision of other normal pregnancy, antepartum   Preterm labor symptoms and general obstetric precautions including but not limited to vaginal bleeding, contractions, leaking of fluid and fetal movement were reviewed in detail with the patient. Please refer to After Visit Summary for other counseling recommendations.  Return in about 3 weeks (around  12/01/2017).  No future appointments.  Allie Bossier, MD

## 2017-12-01 ENCOUNTER — Ambulatory Visit (INDEPENDENT_AMBULATORY_CARE_PROVIDER_SITE_OTHER): Payer: 59 | Admitting: Obstetrics & Gynecology

## 2017-12-01 DIAGNOSIS — Z3A34 34 weeks gestation of pregnancy: Secondary | ICD-10-CM

## 2017-12-01 DIAGNOSIS — Z3493 Encounter for supervision of normal pregnancy, unspecified, third trimester: Secondary | ICD-10-CM

## 2017-12-01 MED ORDER — CETIRIZINE-PSEUDOEPHEDRINE ER 5-120 MG PO TB12: 1.0000 | ORAL_TABLET | Freq: Two times a day (BID) | ORAL | 1 refills | Status: DC

## 2017-12-01 NOTE — Progress Notes (Signed)
   PRENATAL VISIT NOTE  Subjective:  Ariel ManifoldJessica Andrews is a 27 y.o. G2P1001 at 2164w1d being seen today for ongoing prenatal care.  She is currently monitored for the following issues for this low-risk pregnancy and has Anxiety state; Psychogenic vaginismus; Supervision of low-risk pregnancy; and History of pre-eclampsia on their problem list.  Patient reports nasal congestion.  Contractions: Irritability. Vag. Bleeding: None.  Movement: Present. Denies leaking of fluid.   The following portions of the patient's history were reviewed and updated as appropriate: allergies, current medications, past family history, past medical history, past social history, past surgical history and problem list. Problem list updated.  Objective:   Vitals:   12/01/17 1556  BP: 116/69  Pulse: (!) 103  Weight: 176 lb (79.8 kg)    Fetal Status: Fetal Heart Rate (bpm): 153   Movement: Present     General:  Alert, oriented and cooperative. Patient is in no acute distress.  Skin: Skin is warm and dry. No rash noted.   Cardiovascular: Normal heart rate noted  Respiratory: Normal respiratory effort, no problems with respiration noted  Abdomen: Soft, gravid, appropriate for gestational age.  Pain/Pressure: Absent     Pelvic: Cervical exam deferred        Extremities: Normal range of motion.  Edema: None  Mental Status: Normal mood and affect. Normal behavior. Normal judgment and thought content.   Assessment and Plan:  Pregnancy: G2P1001 at 5264w1d  Zyrtec D for nasal stuffiness and sinus congestion.  Can also try nasal steroid..   Pt was GBS last pregnancy Cultures next visit.  Pt interested in self swab for GC/Chlam   Preterm labor symptoms and general obstetric precautions including but not limited to vaginal bleeding, contractions, leaking of fluid and fetal movement were reviewed in detail with the patient. Please refer to After Visit Summary for other counseling recommendations.  Return in about 2 weeks  (around 12/15/2017).  No future appointments.  Elsie LincolnKelly Sumer [place], MD

## 2017-12-16 ENCOUNTER — Ambulatory Visit: Payer: 59 | Admitting: Advanced Practice Midwife

## 2017-12-30 ENCOUNTER — Ambulatory Visit (INDEPENDENT_AMBULATORY_CARE_PROVIDER_SITE_OTHER): Payer: 59 | Admitting: Certified Nurse Midwife

## 2017-12-30 VITALS — BP 130/85 | HR 99 | Wt 182.0 lb

## 2017-12-30 DIAGNOSIS — Z113 Encounter for screening for infections with a predominantly sexual mode of transmission: Secondary | ICD-10-CM | POA: Diagnosis not present

## 2017-12-30 DIAGNOSIS — Z3483 Encounter for supervision of other normal pregnancy, third trimester: Secondary | ICD-10-CM

## 2017-12-30 NOTE — Progress Notes (Signed)
Subjective:  Ariel ManifoldJessica Andrews is a 27 y.o. G2P1001 at 2255w2d being seen today for ongoing prenatal care.  She is currently monitored for the following issues for this low-risk pregnancy and has Anxiety state; Psychogenic vaginismus; Supervision of low-risk pregnancy; and History of pre-eclampsia on their problem list.  Patient reports non-productive cough and nasal congestion. Was given nasal spray and Zyrtec-D but pharmacist recommended against the Zyrtec so she didn't take it. Contractions: Irritability. Vag. Bleeding: None.  Movement: Present. Denies leaking of fluid.   The following portions of the patient's history were reviewed and updated as appropriate: allergies, current medications, past family history, past medical history, past social history, past surgical history and problem list. Problem list updated.  Objective:   Vitals:   12/30/17 1550  BP: 130/85  Pulse: 99  Weight: 82.6 kg    Fetal Status: Fetal Heart Rate (bpm): 146 Fundal Height: 38 cm Movement: Present  Presentation: Vertex  General:  Alert, oriented and cooperative. Patient is in no acute distress.  Skin: Skin is warm and dry. No rash noted.   Cardiovascular: Normal heart rate noted  Respiratory: Normal respiratory effort, no problems with respiration noted; CTAB  Abdomen: Soft, gravid, appropriate for gestational age. Pain/Pressure: Present     Pelvic: Vag. Bleeding: None Vag D/C Character: Thin   Cervical exam deferred        Extremities: Normal range of motion.  Edema: None  Mental Status: Normal mood and affect. Normal behavior. Normal judgment and thought content.   Urinalysis:      Assessment and Plan:  Pregnancy: G2P1001 at 3755w2d  1. Encounter for supervision of other normal pregnancy in third trimester - Culture, beta strep (group b only) - GC/Chlamydia probe amp ([place]not [facility]  2. Upper respiratory virus - recommend Zyrtec and Mucinex  Term labor symptoms and general obstetric  precautions including but not limited to vaginal bleeding, contractions, leaking of fluid and fetal movement were reviewed in detail with the patient. Please refer to After Visit Summary for other counseling recommendations.  Return in about 1 week (around 01/06/2018).   Donette LarryBhambri, Jouri Threat, CNM

## 2017-12-31 LAB — GC/CHLAMYDIA PROBE AMP (~~LOC~~) NOT AT ARMC
CHLAMYDIA, DNA PROBE: NEGATIVE
NEISSERIA GONORRHEA: NEGATIVE

## 2018-01-02 LAB — CULTURE, BETA STREP (GROUP B ONLY)
MICRO NUMBER: 91509345
SPECIMEN QUALITY:: ADEQUATE

## 2018-01-05 ENCOUNTER — Encounter: Payer: Self-pay | Admitting: Certified Nurse Midwife

## 2018-01-08 ENCOUNTER — Encounter: Payer: Self-pay | Admitting: Obstetrics & Gynecology

## 2018-01-08 ENCOUNTER — Ambulatory Visit (INDEPENDENT_AMBULATORY_CARE_PROVIDER_SITE_OTHER): Payer: 59 | Admitting: Obstetrics & Gynecology

## 2018-01-08 VITALS — BP 129/80 | HR 97 | Wt 179.0 lb

## 2018-01-08 DIAGNOSIS — Z3483 Encounter for supervision of other normal pregnancy, third trimester: Secondary | ICD-10-CM

## 2018-01-08 NOTE — Progress Notes (Signed)
   PRENATAL VISIT NOTE  Subjective:  Ariel Andrews is a 27 y.o. G2P1001 at 3165w4d being seen today for ongoing prenatal care.  She is currently monitored for the following issues for this low-risk pregnancy and has Psychogenic vaginismus; GBS (group B Streptococcus carrier), +RV culture, currently pregnant; Supervision of low-risk pregnancy; and History of pre-eclampsia on their problem list.  Patient reports She would like an IOL as her husband is home for the next week..  Contractions: Irritability. Vag. Bleeding: None.  Movement: Present. Denies leaking of fluid.   The following portions of the patient's history were reviewed and updated as appropriate: allergies, current medications, past family history, past medical history, past social history, past surgical history and problem list. Problem list updated.  Objective:   Vitals:   01/08/18 1436  BP: 129/80  Pulse: 97  Weight: 179 lb (81.2 kg)    Fetal Status:     Movement: Present     General:  Alert, oriented and cooperative. Patient is in no acute distress.  Skin: Skin is warm and dry. No rash noted.   Cardiovascular: Normal heart rate noted  Respiratory: Normal respiratory effort, no problems with respiration noted  Abdomen: Soft, gravid, appropriate for gestational age.  Pain/Pressure: Present     Pelvic: Cervical exam performed        Extremities: Normal range of motion.  Edema: Trace  Mental Status: Normal mood and affect. Normal behavior. Normal judgment and thought content.   Assessment and Plan:  Pregnancy: G2P1001 at 6065w4d  1. Encounter for supervision of other normal pregnancy in third trimester - IOL tomorrow   Term labor symptoms and general obstetric precautions including but not limited to vaginal bleeding, contractions, leaking of fluid and fetal movement were reviewed in detail with the patient. Please refer to After Visit Summary for other counseling recommendations.  Return in about 4 weeks (around  02/05/2018) for postpartum visit.  Future Appointments  Date Time Provider Department Center  01/08/2018  4:15 PM Allie Bossierove, Makina Skow C, MD [facility]  01/09/2018  7:30 AM [facility] None    Allie BossierMyra C Amiel Sharrow, MD

## 2018-01-09 ENCOUNTER — Other Ambulatory Visit: Payer: Self-pay

## 2018-01-09 ENCOUNTER — Inpatient Hospital Stay (HOSPITAL_COMMUNITY): Admission: RE | Admit: 2018-01-09 | Payer: 59 | Source: Ambulatory Visit

## 2018-01-09 ENCOUNTER — Inpatient Hospital Stay (EMERGENCY_DEPARTMENT_HOSPITAL)
Admission: AD | Admit: 2018-01-09 | Discharge: 2018-01-09 | Disposition: A | Discharge status: Home / Self Care | Payer: 59 | Source: Ambulatory Visit | Attending: Obstetrics & Gynecology | Admitting: Obstetrics & Gynecology

## 2018-01-09 DIAGNOSIS — Z8759 Personal history of other complications of pregnancy, childbirth and the puerperium: Secondary | ICD-10-CM

## 2018-01-09 DIAGNOSIS — A084 Viral intestinal infection, unspecified: Secondary | ICD-10-CM | POA: Insufficient documentation

## 2018-01-09 DIAGNOSIS — Z3A39 39 weeks gestation of pregnancy: Secondary | ICD-10-CM | POA: Insufficient documentation

## 2018-01-09 DIAGNOSIS — O99613 Diseases of the digestive system complicating pregnancy, third trimester: Secondary | ICD-10-CM | POA: Insufficient documentation

## 2018-01-09 DIAGNOSIS — O480 Post-term pregnancy: Secondary | ICD-10-CM | POA: Diagnosis not present

## 2018-01-09 DIAGNOSIS — O26893 Other specified pregnancy related conditions, third trimester: Secondary | ICD-10-CM

## 2018-01-09 LAB — COMPREHENSIVE METABOLIC PANEL
ALT: 38 U/L (ref 0–44)
AST: 50 U/L — ABNORMAL HIGH (ref 15–41)
Albumin: 3.1 g/dL — ABNORMAL LOW (ref 3.5–5.0)
Alkaline Phosphatase: 181 U/L — ABNORMAL HIGH (ref 38–126)
Anion gap: 12 (ref 5–15)
BILIRUBIN TOTAL: 1.8 mg/dL — AB (ref 0.3–1.2)
BUN: 17 mg/dL (ref 6–20)
CO2: 17 mmol/L — ABNORMAL LOW (ref 22–32)
Calcium: 9.3 mg/dL (ref 8.9–10.3)
Chloride: 107 mmol/L (ref 98–111)
Creatinine, Ser: 0.69 mg/dL (ref 0.44–1.00)
GFR calc Af Amer: >60 mL/min (ref >60)
GFR calc non Af Amer: >60 mL/min (ref >60)
Glucose, Bld: 84 mg/dL (ref 70–99)
Potassium: 4.7 mmol/L (ref 3.5–5.1)
Sodium: 136 mmol/L (ref 135–145)
TOTAL PROTEIN: 7.4 g/dL (ref 6.5–8.1)

## 2018-01-09 LAB — CBC
HCT: 41.9 % (ref 36.0–46.0)
Hemoglobin: 14.3 g/dL (ref 12.0–15.0)
MCH: 30.7 pg (ref 26.0–34.0)
MCHC: 34.1 g/dL (ref 30.0–36.0)
MCV: 89.9 fL (ref 80.0–100.0)
Platelets: 202 10*3/uL (ref 150–400)
RBC: 4.66 MIL/uL (ref 3.87–5.11)
RDW: 14 % (ref 11.5–15.5)
WBC: 19.8 10*3/uL — ABNORMAL HIGH (ref 4.0–10.5)
nRBC: 0 % (ref 0.0–0.2)

## 2018-01-09 LAB — URINALYSIS, ROUTINE W REFLEX MICROSCOPIC
Bilirubin Urine: NEGATIVE
GLUCOSE, UA: NEGATIVE mg/dL
Ketones, ur: 80 mg/dL — AB
NITRITE: NEGATIVE
Protein, ur: 100 mg/dL — AB
Specific Gravity, Urine: 1.028 (ref 1.005–1.030)
pH: 5 (ref 5.0–8.0)

## 2018-01-09 MED ORDER — ONDANSETRON 8 MG PO TBDP: 8.0000 mg | ORAL_TABLET | Freq: Three times a day (TID) | ORAL | 0 refills | Status: DC | PRN

## 2018-01-09 MED ORDER — LACTATED RINGERS IV BOLUS
1000.0000 mL | Freq: Once | INTRAVENOUS | Status: AC
  Administered 2018-01-09 (×2): 1000 mL via INTRAVENOUS

## 2018-01-09 MED ORDER — SODIUM CHLORIDE 0.9 % IV SOLN
8.0000 mg | Freq: Once | INTRAVENOUS | Status: AC
  Administered 2018-01-09: 8 mg via INTRAVENOUS
  Filled 2018-01-09: qty 4

## 2018-01-09 MED ORDER — LACTATED RINGERS IV BOLUS: 1000.0000 mL | Freq: Once | INTRAVENOUS | Status: DC

## 2018-01-09 NOTE — Discharge Instructions (Signed)
Food Choices to Help Relieve Diarrhea, Adult  When you have diarrhea, the foods you eat and your eating habits are very important. Choosing the right foods and drinks can help:   Relieve diarrhea.   Replace lost fluids and nutrients.   Prevent dehydration.  What general guidelines should I follow?    Relieving diarrhea   Choose foods with less than 2 g or .07 oz. of fiber per serving.   Limit fats to less than 8 tsp (38 g or 1.34 oz.) a day.   Avoid the following:  ? Foods and beverages sweetened with high-fructose corn syrup, honey, or sugar alcohols such as xylitol, sorbitol, and mannitol.  ? Foods that contain a lot of fat or sugar.  ? Fried, greasy, or spicy foods.  ? High-fiber grains, breads, and cereals.  ? Raw fruits and vegetables.   Eat foods that are rich in probiotics. These foods include dairy products such as yogurt and fermented milk products. They help increase healthy bacteria in the stomach and intestines (gastrointestinal tract, or GI tract).   If you have lactose intolerance, avoid dairy products. These may make your diarrhea worse.   Take medicine to help stop diarrhea (antidiarrheal medicine) only as told by your health care provider.  Replacing nutrients   Eat small meals or snacks every 3-4 hours.   Eat bland foods, such as white rice, toast, or baked potato, until your diarrhea starts to get better. Gradually reintroduce nutrient-rich foods as tolerated or as told by your health care provider. This includes:  ? Well-cooked protein foods.  ? Peeled, seeded, and soft-cooked fruits and vegetables.  ? Low-fat dairy products.   Take vitamin and mineral supplements as told by your health care provider.  Preventing dehydration   Start by sipping water or a special solution to prevent dehydration (oral rehydration solution, ORS). Urine that is clear or pale yellow means that you are getting enough fluid.   Try to drink at least 8-10 cups of fluid each day to help replace lost  fluids.   You may add other liquids in addition to water, such as clear juice or decaffeinated sports drinks, as tolerated or as told by your health care provider.   Avoid drinks with caffeine, such as coffee, tea, or soft drinks.   Avoid alcohol.  What foods are recommended?         The items listed may not be a complete list. Talk with your health care provider about what dietary choices are best for you.  Grains  White rice. White, French, or pita breads (fresh or toasted), including plain rolls, buns, or bagels. White pasta. Saltine, soda, or graham crackers. Pretzels. Low-fiber cereal. Cooked cereals made with water (such as cornmeal, farina, or cream cereals). Plain muffins. Matzo. Melba toast. Zwieback.  Vegetables  Potatoes (without the skin). Most well-cooked and canned vegetables without skins or seeds. Tender lettuce.  Fruits  Apple sauce. Fruits canned in juice. Cooked apricots, cherries, grapefruit, peaches, pears, or plums. Fresh bananas and cantaloupe.  Meats and other protein foods  Baked or boiled chicken. Eggs. Tofu. Fish. Seafood. Smooth nut butters. Ground or well-cooked tender beef, ham, veal, lamb, pork, or poultry.  Dairy  Plain yogurt, kefir, and unsweetened liquid yogurt. Lactose-free milk, buttermilk, skim milk, or soy milk. Low-fat or nonfat hard cheese.  Beverages  Water. Low-calorie sports drinks. Fruit juices without pulp. Strained tomato and vegetable juices. Decaffeinated teas. Sugar-free beverages not sweetened with sugar alcohols. Oral rehydration solutions, if   approved by your health care provider.  Seasoning and other foods  Bouillon, broth, or soups made from recommended foods.  What foods are not recommended?  The items listed may not be a complete list. Talk with your health care provider about what dietary choices are best for you.  Grains  Whole grain, whole wheat, bran, or rye breads, rolls, pastas, and crackers. Wild or brown rice. Whole grain or bran cereals. Barley.  Oats and oatmeal. Corn tortillas or taco shells. Granola. Popcorn.  Vegetables  Raw vegetables. Fried vegetables. Cabbage, broccoli, Brussels sprouts, artichokes, baked beans, beet greens, corn, kale, legumes, peas, sweet potatoes, and yams. Potato skins. Cooked spinach and cabbage.  Fruits  Dried fruit, including raisins and dates. Raw fruits. Stewed or dried prunes. Canned fruits with syrup.  Meat and other protein foods  Fried or fatty meats. Deli meats. Chunky nut butters. Nuts and seeds. Beans and lentils. Bacon. Hot dogs. Sausage.  Dairy  High-fat cheeses. Whole milk, chocolate milk, and beverages made with milk, such as milk shakes. Half-and-half. Cream. sour cream. Ice cream.  Beverages  Caffeinated beverages (such as coffee, tea, soda, or energy drinks). Alcoholic beverages. Fruit juices with pulp. Prune juice. Soft drinks sweetened with high-fructose corn syrup or sugar alcohols. High-calorie sports drinks.  Fats and oils  Butter. Cream sauces. Margarine. Salad oils. Plain salad dressings. Olives. Avocados. Mayonnaise.  Sweets and desserts  Sweet rolls, doughnuts, and sweet breads. Sugar-free desserts sweetened with sugar alcohols such as xylitol and sorbitol.  Seasoning and other foods  Honey. Hot sauce. Chili powder. Gravy. Cream-based or milk-based soups. Pancakes and waffles.  Summary   When you have diarrhea, the foods you eat and your eating habits are very important.   Make sure you get at least 8-10 cups of fluid each day, or enough to keep your urine clear or pale yellow.   Eat bland foods and gradually reintroduce healthy, nutrient-rich foods as tolerated, or as told by your health care provider.   Avoid high-fiber, fried, greasy, or spicy foods.  This information is not intended to replace advice given to you by your health care provider. Make sure you discuss any questions you have with your health care provider.  Document Released: 03/23/2003 Document Revised: 12/29/2015 Document Reviewed:  12/29/2015  Elsevier Interactive Patient Education  2019 [employer]

## 2018-01-09 NOTE — MAU Provider Note (Addendum)
History     CSN: [id number]  Arrival date and time: 01/09/18 [id number]   First Provider Initiated Contact with Patient 01/09/18 1931      Chief Complaint  Patient presents with  . Nausea  . Emesis   G2P1001 @39 .5 wks here with N/V/D since 8am. Unable to tolerate anything po. Several family members with same sx. No fevers. Feeling good FM. No VB or ctx.   OB History    Gravida  2   Para  1   Term  1   Preterm      AB      Living  1     SAB      TAB      Ectopic      Multiple  0   Live Births  1           No past medical history on file.  Past Surgical History:  Procedure Laterality Date  . CYST EXCISION      Family History  Problem Relation Age of Onset  . Colon polyps Mother   . Colon cancer Maternal Grandfather   . Heart disease Maternal Grandfather     Social History   Tobacco Use  . Smoking status: Never Smoker  . Smokeless tobacco: Current User  Substance Use Topics  . Alcohol use: No    Alcohol/week: 0.0 standard drinks  . Drug use: No    Allergies: No Known Allergies  Medications Prior to Admission  Medication Sig Dispense Refill Last Dose  . acetaminophen (TYLENOL) 500 MG tablet Take 1,000 mg by mouth every 6 (six) hours as needed for moderate pain.   Taking  . cetirizine-pseudoephedrine (ZYRTEC-D ALLERGY & CONGESTION) 5-120 MG tablet Take 1 tablet by mouth 2 (two) times daily. 60 tablet 1 Taking  . Fenugreek 500 MG CAPS Take by mouth.   Taking  . Prenatal Vit-Fe Fumarate-FA (PRENATAL VITAMIN PLUS LOW IRON) 27-1 MG TABS Take 1 tablet by mouth daily. 30 tablet 12 Taking    Review of Systems  Constitutional: Negative for fever.  Gastrointestinal: Positive for diarrhea, nausea and vomiting. Negative for abdominal pain.  Genitourinary: Negative for vaginal bleeding.   Physical Exam   Blood pressure 111/60, pulse (!) 110, temperature 97.7 F (36.5 C), temperature source Oral, resp. rate 18, height 5\' 2"  (1.575 m), weight 81 kg,  last menstrual period 04/03/2017, SpO2 99 %, currently breastfeeding.  Physical Exam  Constitutional: She is oriented to person, place, and time. She appears well-developed and well-nourished. No distress.  HENT:  Head: Normocephalic and atraumatic.  Neck: Normal range of motion.  Respiratory: Effort normal. No respiratory distress.  Musculoskeletal: Normal range of motion.  Neurological: She is alert and oriented to person, place, and time.  Psychiatric: She has a normal mood and affect.  EFM: 155 bpm, mod variability, + accels, no decels Toco: irritability  Results for orders placed or performed during the hospital encounter of 01/09/18 (from the past 24 hour(s))  Urinalysis, Routine w reflex microscopic     Status: Abnormal   Collection Time: 01/09/18  7:12 PM  Result Value Ref Range   Color, Urine AMBER (A) YELLOW   APPearance HAZY (A) CLEAR   Specific Gravity, Urine 1.028 1.005 - 1.030   pH 5.0 5.0 - 8.0   Glucose, UA NEGATIVE NEGATIVE mg/dL   Hgb urine dipstick MODERATE (A) NEGATIVE   Bilirubin Urine NEGATIVE NEGATIVE   Ketones, ur 80 (A) NEGATIVE mg/dL   Protein, ur 119100 (A) NEGATIVE  mg/dL   Nitrite NEGATIVE NEGATIVE   Leukocytes, UA TRACE (A) NEGATIVE   RBC / HPF 11-20 0 - 5 RBC/hpf   WBC, UA 6-10 0 - 5 WBC/hpf   Bacteria, UA RARE (A) NONE SEEN   Squamous Epithelial / LPF 11-20 0 - 5   Mucus PRESENT    MAU Course  Procedures LR Zofran  MDM Labs ordered. Transfer of care given to Leda MinHogan, CNM Bhambri, Melanie, [place]  01/09/2018 8:48 PM   Patient has had fluids. She reports feeling better. Will dc home  Patient with elevated AST. Reviewed with Dr. Macon LargeAnyanwu, ok for DC home. Likely related to gastroenteritis.   Assessment and Plan   Viral gastroenteritis [redacted] weeks gestations   DC home Comfort measures reviewed  3rd Trimester precautions  labor precautions  Fetal kick counts RX: zofran PRN #[id number]  Return to MAU as needed Return for IOL on 01/15/18   Follow-up  Information    [facility] up.   Specialty:  Obstetrics and Gynecology Contact information: [address], [address] [place] [id number] [phone]

## 2018-01-09 NOTE — MAU Note (Signed)
Pt reports vomiting since this am, diarrhea this am but that stopped

## 2018-01-10 ENCOUNTER — Other Ambulatory Visit: Payer: Self-pay | Admitting: Advanced Practice Midwife

## 2018-01-12 ENCOUNTER — Inpatient Hospital Stay (HOSPITAL_COMMUNITY): Payer: 59 | Admitting: Anesthesiology

## 2018-01-12 ENCOUNTER — Encounter (HOSPITAL_COMMUNITY): Payer: Self-pay

## 2018-01-12 ENCOUNTER — Inpatient Hospital Stay (HOSPITAL_COMMUNITY)
Admission: RE | Admit: 2018-01-12 | Discharge: 2018-01-14 | DRG: 807 | Disposition: A | Discharge status: Home / Self Care | Payer: 59 | Attending: Obstetrics and Gynecology | Admitting: Obstetrics and Gynecology

## 2018-01-12 DIAGNOSIS — O99824 Streptococcus B carrier state complicating childbirth: Secondary | ICD-10-CM | POA: Diagnosis present

## 2018-01-12 DIAGNOSIS — Z3A40 40 weeks gestation of pregnancy: Secondary | ICD-10-CM

## 2018-01-12 DIAGNOSIS — O480 Post-term pregnancy: Principal | ICD-10-CM | POA: Diagnosis present

## 2018-01-12 DIAGNOSIS — O326XX0 Maternal care for compound presentation, not applicable or unspecified: Secondary | ICD-10-CM | POA: Diagnosis present

## 2018-01-12 DIAGNOSIS — Z3490 Encounter for supervision of normal pregnancy, unspecified, unspecified trimester: Secondary | ICD-10-CM | POA: Diagnosis present

## 2018-01-12 DIAGNOSIS — Z8759 Personal history of other complications of pregnancy, childbirth and the puerperium: Secondary | ICD-10-CM

## 2018-01-12 HISTORY — DX: Other specified health status: Z78.9

## 2018-01-12 LAB — CBC
HCT: 36.4 % (ref 36.0–46.0)
Hemoglobin: 12.3 g/dL (ref 12.0–15.0)
MCH: 30 pg (ref 26.0–34.0)
MCHC: 33.8 g/dL (ref 30.0–36.0)
MCV: 88.8 fL (ref 80.0–100.0)
NRBC: 0 % (ref 0.0–0.2)
Platelets: 192 10*3/uL (ref 150–400)
RBC: 4.1 MIL/uL (ref 3.87–5.11)
RDW: 13.9 % (ref 11.5–15.5)
WBC: 14.6 10*3/uL — ABNORMAL HIGH (ref 4.0–10.5)

## 2018-01-12 LAB — RPR: RPR Ser Ql: NONREACTIVE

## 2018-01-12 LAB — TYPE AND SCREEN
ABO/RH(D): O POS
Antibody Screen: NEGATIVE

## 2018-01-12 MED ORDER — PENICILLIN G 3 MILLION UNITS IVPB - SIMPLE MED
3.0000 10*6.[IU] | INTRAVENOUS | Status: DC
  Administered 2018-01-12: 3 10*6.[IU] via INTRAVENOUS
  Filled 2018-01-12 (×4): qty 100

## 2018-01-12 MED ORDER — COCONUT OIL OIL: 1.0000 "application " | TOPICAL_OIL | Status: DC | PRN

## 2018-01-12 MED ORDER — OXYCODONE-ACETAMINOPHEN 5-325 MG PO TABS: 1.0000 | ORAL_TABLET | ORAL | Status: DC | PRN

## 2018-01-12 MED ORDER — PHENYLEPHRINE 40 MCG/ML (10ML) SYRINGE FOR IV PUSH (FOR BLOOD PRESSURE SUPPORT): 80.0000 ug | PREFILLED_SYRINGE | INTRAVENOUS | Status: DC | PRN

## 2018-01-12 MED ORDER — OXYTOCIN BOLUS FROM INFUSION
500.0000 mL | Freq: Once | INTRAVENOUS | Status: AC
  Administered 2018-01-12: 500 mL via INTRAVENOUS

## 2018-01-12 MED ORDER — ACETAMINOPHEN 325 MG PO TABS: 650.0000 mg | ORAL_TABLET | ORAL | Status: DC | PRN

## 2018-01-12 MED ORDER — FENTANYL 2.5 MCG/ML BUPIVACAINE 1/10 % EPIDURAL INFUSION (WH - ANES)
14.0000 mL/h | INTRAMUSCULAR | Status: DC | PRN
  Administered 2018-01-12: 14 mL/h via EPIDURAL
  Filled 2018-01-12: qty 100

## 2018-01-12 MED ORDER — OXYCODONE-ACETAMINOPHEN 5-325 MG PO TABS: 2.0000 | ORAL_TABLET | ORAL | Status: DC | PRN

## 2018-01-12 MED ORDER — ONDANSETRON HCL 4 MG/2ML IJ SOLN: 4.0000 mg | INTRAMUSCULAR | Status: DC | PRN

## 2018-01-12 MED ORDER — OXYTOCIN 40 UNITS IN LACTATED RINGERS INFUSION - SIMPLE MED: 2.5000 [IU]/h | INTRAVENOUS | Status: DC

## 2018-01-12 MED ORDER — SENNOSIDES-DOCUSATE SODIUM 8.6-50 MG PO TABS
2.0000 | ORAL_TABLET | ORAL | Status: DC
  Administered 2018-01-13 (×2): 2 via ORAL
  Filled 2018-01-12 (×3): qty 2

## 2018-01-12 MED ORDER — SOD CITRATE-CITRIC ACID 500-334 MG/5ML PO SOLN: 30.0000 mL | ORAL | Status: DC | PRN

## 2018-01-12 MED ORDER — LACTATED RINGERS IV SOLN: 500.0000 mL | Freq: Once | INTRAVENOUS | Status: DC

## 2018-01-12 MED ORDER — WITCH HAZEL-GLYCERIN EX PADS: 1.0000 "application " | MEDICATED_PAD | CUTANEOUS | Status: DC | PRN

## 2018-01-12 MED ORDER — LACTATED RINGERS IV SOLN
INTRAVENOUS | Status: DC
  Administered 2018-01-12 (×2): via INTRAVENOUS

## 2018-01-12 MED ORDER — IBUPROFEN 600 MG PO TABS
600.0000 mg | ORAL_TABLET | Freq: Four times a day (QID) | ORAL | Status: DC
  Administered 2018-01-12 – 2018-01-14 (×7): 600 mg via ORAL
  Filled 2018-01-12 (×8): qty 1

## 2018-01-12 MED ORDER — TERBUTALINE SULFATE 1 MG/ML IJ SOLN
0.2500 mg | Freq: Once | INTRAMUSCULAR | Status: DC | PRN
  Filled 2018-01-12: qty 1

## 2018-01-12 MED ORDER — ONDANSETRON HCL 4 MG PO TABS: 4.0000 mg | ORAL_TABLET | ORAL | Status: DC | PRN

## 2018-01-12 MED ORDER — DIPHENHYDRAMINE HCL 25 MG PO CAPS: 25.0000 mg | ORAL_CAPSULE | Freq: Four times a day (QID) | ORAL | Status: DC | PRN

## 2018-01-12 MED ORDER — SODIUM CHLORIDE 0.9 % IV SOLN
5.0000 10*6.[IU] | Freq: Once | INTRAVENOUS | Status: AC
  Administered 2018-01-12: 5 10*6.[IU] via INTRAVENOUS
  Filled 2018-01-12: qty 5

## 2018-01-12 MED ORDER — SIMETHICONE 80 MG PO CHEW: 80.0000 mg | CHEWABLE_TABLET | ORAL | Status: DC | PRN

## 2018-01-12 MED ORDER — LIDOCAINE HCL (PF) 1 % IJ SOLN
30.0000 mL | INTRAMUSCULAR | Status: AC | PRN
  Administered 2018-01-12: 30 mL via SUBCUTANEOUS
  Filled 2018-01-12: qty 30

## 2018-01-12 MED ORDER — PHENYLEPHRINE 40 MCG/ML (10ML) SYRINGE FOR IV PUSH (FOR BLOOD PRESSURE SUPPORT)
80.0000 ug | PREFILLED_SYRINGE | INTRAVENOUS | Status: DC | PRN
  Filled 2018-01-12: qty 10

## 2018-01-12 MED ORDER — EPHEDRINE 5 MG/ML INJ
10.0000 mg | INTRAVENOUS | Status: DC | PRN
  Filled 2018-01-12: qty 2

## 2018-01-12 MED ORDER — ONDANSETRON HCL 4 MG/2ML IJ SOLN: 4.0000 mg | Freq: Four times a day (QID) | INTRAMUSCULAR | Status: DC | PRN

## 2018-01-12 MED ORDER — LIDOCAINE HCL (PF) 1 % IJ SOLN
INTRAMUSCULAR | Status: DC | PRN
  Administered 2018-01-12: 5 mL via EPIDURAL

## 2018-01-12 MED ORDER — DIBUCAINE 1 % RE OINT: 1.0000 "application " | TOPICAL_OINTMENT | RECTAL | Status: DC | PRN

## 2018-01-12 MED ORDER — LACTATED RINGERS IV SOLN: 500.0000 mL | INTRAVENOUS | Status: DC | PRN

## 2018-01-12 MED ORDER — BENZOCAINE-MENTHOL 20-0.5 % EX AERO
1.0000 "application " | INHALATION_SPRAY | CUTANEOUS | Status: DC | PRN
  Administered 2018-01-12: 1 via TOPICAL
  Filled 2018-01-12 (×2): qty 56

## 2018-01-12 MED ORDER — PRENATAL MULTIVITAMIN CH
1.0000 | ORAL_TABLET | Freq: Every day | ORAL | Status: DC
  Administered 2018-01-13: 1 via ORAL
  Filled 2018-01-12: qty 1

## 2018-01-12 MED ORDER — ACETAMINOPHEN 325 MG PO TABS
650.0000 mg | ORAL_TABLET | ORAL | Status: DC | PRN
  Administered 2018-01-13: 650 mg via ORAL
  Filled 2018-01-12: qty 2

## 2018-01-12 MED ORDER — DIPHENHYDRAMINE HCL 50 MG/ML IJ SOLN: 12.5000 mg | INTRAMUSCULAR | Status: DC | PRN

## 2018-01-12 MED ORDER — TETANUS-DIPHTH-ACELL PERTUSSIS 5-2.5-18.5 LF-MCG/0.5 IM SUSP: 0.5000 mL | Freq: Once | INTRAMUSCULAR | Status: DC

## 2018-01-12 MED ORDER — EPHEDRINE 5 MG/ML INJ: 10.0000 mg | INTRAVENOUS | Status: DC | PRN

## 2018-01-12 MED ORDER — PHENYLEPHRINE 40 MCG/ML (10ML) SYRINGE FOR IV PUSH (FOR BLOOD PRESSURE SUPPORT)
80.0000 ug | PREFILLED_SYRINGE | INTRAVENOUS | Status: DC | PRN
  Filled 2018-01-12 (×2): qty 10

## 2018-01-12 MED ORDER — OXYTOCIN 40 UNITS IN LACTATED RINGERS INFUSION - SIMPLE MED
1.0000 m[IU]/min | INTRAVENOUS | Status: DC
  Administered 2018-01-12: 2 m[IU]/min via INTRAVENOUS
  Filled 2018-01-12: qty 1000

## 2018-01-12 NOTE — H&P (Addendum)
LABOR AND DELIVERY ADMISSION HISTORY AND PHYSICAL NOTE  Ariel Andrews is a 27 y.o. female G2P1001 with IUP at 7119w1d presenting for elective IOL.  She reports positive fetal movement. She denies leakage of fluid or vaginal bleeding.  [facility] Pregnancy complications:  - GBS carrier - hx of PEC   Past Medical History: Past Medical History:  Diagnosis Date  . Medical history non-contributory     Past Surgical History: Past Surgical History:  Procedure Laterality Date  . CYST EXCISION      Obstetrical History: OB History    Gravida  2   Para  1   Term  1   Preterm      AB      Living  1     SAB      TAB      Ectopic      Multiple  0   Live Births  1           Social History: Social History   Socioeconomic History  . Marital status: Married    Spouse name: Not on file  . Number of children: Not on file  . Years of education: Not on file  . Highest education level: Not on file  Occupational History  . Not on file  Social Needs  . Financial resource strain: Not on file  . Food insecurity:    Worry: Not on file    Inability: Not on file  . Transportation needs:    Medical: Not on file    Non-medical: Not on file  Tobacco Use  . Smoking status: Never Smoker  . Smokeless tobacco: Current User  Substance and Sexual Activity  . Alcohol use: No    Alcohol/week: 0.0 standard drinks  . Drug use: No  . Sexual activity: Yes    Birth control/protection: None  Lifestyle  . Physical activity:    Days per week: Not on file    Minutes per session: Not on file  . Stress: Not on file  Relationships  . Social connections:    Talks on phone: Not on file    Gets together: Not on file    Attends religious service: Not on file    Active member of club or organization: Not on file    Attends meetings of clubs or organizations: Not on file    Relationship status: Not on file  Other Topics Concern  . Not on file   Social History Narrative  . Not on file    Family History: Family History  Problem Relation Age of Onset  . Colon polyps Mother   . Colon cancer Maternal Grandfather   . Heart disease Maternal Grandfather     Allergies: No Known Allergies  Medications Prior to Admission  Medication Sig Dispense Refill Last Dose  . cetirizine-pseudoephedrine (ZYRTEC-D ALLERGY & CONGESTION) 5-120 MG tablet Take 1 tablet by mouth 2 (two) times daily. 60 tablet 1 Taking  . Fenugreek 500 MG CAPS Take by mouth.   Taking  . ondansetron (ZOFRAN ODT) 8 MG disintegrating tablet Take 1 tablet (8 mg total) by mouth every 8 (eight) hours as needed for nausea or vomiting. 20 tablet 0   . Prenatal Vit-Fe Fumarate-FA (PRENATAL VITAMIN PLUS LOW IRON) 27-1 MG TABS Take 1 tablet by mouth daily. 30 tablet 12 Taking     Review of Systems  All systems reviewed and negative except as stated in HPI  Physical Exam Blood pressure 117/69, pulse 74,  temperature 97.8 F (36.6 C), temperature source Oral, resp. rate 18, height 5\' 2"  (1.575 m), weight 80.4 kg General appearance: alert, oriented Lungs: normal respiratory effort Heart: regular rate Abdomen: soft, non-tender; gravid, FH appropriate for GA Extremities: No calf swelling or tenderness Presentation: cephalic  Fetal monitoring: NST reactive, 135 baseline HR, moderate variability, accelerations present, no decelerations Uterine activity: mild contractions, irregular every 2-7 minutes lasting for 50-60 seconds Dilation: 4.5 Effacement (%): 80 Station: -2 Exam by:: Lucianne MussLima, rn  Prenatal labs: ABO, Rh: --/--/O POS (12/30 16100749) Antibody: NEG (12/30 0749) Rubella: 3.22 (06/06 1333) RPR: NON-REACTIVE (10/07 0835)  HBsAg: NON-REACTIVE (06/06 1333)  HIV: NON-REACTIVE (10/07 0835)  GC/Chlamydia: negative GBS:  positive  2-hr GTT: negative  Genetic screening: Panorama low risk  Anatomy US: normal   Prenatal Transfer Tool  Maternal Diabetes: No Genetic  Screening: Normal Maternal Ultrasounds/Referrals: Declined Fetal Ultrasounds or other Referrals:  None Maternal Substance Abuse:  No Significant Maternal Medications:  None Significant Maternal Lab Results: None  Results for orders placed or performed during the hospital encounter of 01/12/18 (from the past 24 hour(s))  CBC   Collection Time: 01/12/18  7:49 AM  Result Value Ref Range   WBC 14.6 (H) 4.0 - 10.5 K/uL   RBC 4.10 3.87 - 5.11 MIL/uL   Hemoglobin 12.3 12.0 - 15.0 g/dL   HCT 96.036.4 45.436.0 - 09.846.0 %   MCV 88.8 80.0 - 100.0 fL   MCH 30.0 26.0 - 34.0 pg   MCHC 33.8 30.0 - 36.0 g/dL   RDW 11.913.9 14.711.5 - 82.915.5 %   Platelets 192 150 - 400 K/uL   nRBC 0.0 0.0 - 0.2 %  Type and screen   Collection Time: 01/12/18  7:49 AM  Result Value Ref Range   ABO/RH(D) O POS    Antibody Screen NEG    Sample Expiration      01/15/2018 Performed at [facility], [address] [place], [place] [address]     Patient Active Problem List   Diagnosis Date Noted  . Encounter for planned induction of labor 01/12/2018  . Supervision of low-risk pregnancy 06/19/2017  . History of pre-eclampsia 06/19/2017  . GBS (group B Streptococcus carrier), +RV culture, currently pregnant 07/05/2015  . 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 G2P1001 at 2971w1d here for elective IOL.   #Labor: favorable cervix, begin titration of pitocin  #Pain: Plans to get epidural, maternal ambulation and position changes until patient desires epidural.  #FWB: Category 1  #ID:  GBS+, PCN ordered #MOF: breast #MOC: mirena IUD #Circ:  Desires in hospital   Sigurd SosStephenia M Deloglos 01/12/2018, 9:47 AM   Midwife attestation: I have seen and examined this patient; I agree with above documentation in the student's note.   PE: Gen: calm comfortable, NAD Resp: normal effort and rate Abd: gravid  ROS, labs, PMH reviewed  Assessment/Plan: Ariel ManifoldJessica Andrews is a 27 y.o. G2P1001 here for  elective IOL Admit to LD Labor: latent FWB: Cat I ID: GBS pos>PCN Pain: analgesia/epidural prn  Donette LarryMelanie Kizzy Olafson, CNM  01/12/2018, 10:27 AM

## 2018-01-12 NOTE — Progress Notes (Signed)
MOB was referred for history of depression/anxiety. * Referral screened out by Clinical Social Worker because none of the following criteria appear to apply: ~ History of anxiety/depression during this pregnancy, or of post-partum depression following prior delivery. ~ Diagnosis of anxiety and/or depression within last 3 years OR * MOB's symptoms currently being treated with medication and/or therapy. Please contact the Clinical Social Worker if needs arise, by MOB request, or if MOB scores greater than 9/yes to question 10 on Edinburgh Postpartum Depression Screen.  Jacorie Ernsberger, LCSW Clinical Social Worker  [facility]  [phone]  

## 2018-01-12 NOTE — Progress Notes (Addendum)
LABOR PROGRESS NOTE  Ariel ManifoldJessica Andrews is a 27 y.o. G2P1001 at 8049w1d  admitted for elective IOL. Currently on 216mu/min of pitocin, received first dose of pcn   Subjective: Doing well, coping well with contractions.   Objective: BP 117/69   Pulse 74   Temp 97.8 F (36.6 C) (Oral)   Resp 18   Ht 5\' 2"  (1.575 m)   Wt 80.4 kg   LMP 04/03/2017   BMI 32.41 kg/m  or  Vitals:   01/12/18 0752 01/12/18 0826 01/12/18 0902 01/12/18 0933  BP:  114/67 122/75 117/69  Pulse:  83 72 74  Resp:  18 18 18   Temp:      TempSrc:      Weight: 80.4 kg     Height: 5\' 2"  (1.575 m)       0820 Dilation: 4.5 Effacement (%): 80 Cervical Position: Posterior Station: -2 Presentation: Vertex Exam by:: Lima, rn FHT: baseline rate 135, moderate varibility, acel present, no decel Toco: contractions every 2-4 minutes lasting 50-60 seconds   Labs: Lab Results  Component Value Date   WBC 14.6 (H) 01/12/2018   HGB 12.3 01/12/2018   HCT 36.4 01/12/2018   MCV 88.8 01/12/2018   PLT 192 01/12/2018    Patient Active Problem List   Diagnosis Date Noted  . Encounter for planned induction of labor 01/12/2018  . Supervision of low-risk pregnancy 06/19/2017  . History of pre-eclampsia 06/19/2017  . GBS (group B Streptococcus carrier), +RV culture, currently pregnant 07/05/2015  . 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 G2P1001 at 7649w1d here for elective IOL  Labor: continue to titrate pitocin per order  Fetal Wellbeing:  Category 1  I/D: continue PCN per order  Pain Control:  Patient plans to get epidural, ambulating and position changes until she desires epidural Anticipated MOD:  Vaginal    01/12/2018, 10:05 AM

## 2018-01-12 NOTE — Anesthesia Procedure Notes (Signed)
Epidural Patient location during procedure: OB Start time: 01/12/2018 11:24 AM End time: 01/12/2018 11:37 AM  Staffing Anesthesiologist: Trevor IhaHouser, Amarius Toto A, MD Performed: anesthesiologist   Preanesthetic Checklist Completed: patient identified, site marked, surgical consent, pre-op evaluation, timeout performed, IV checked, risks and benefits discussed and monitors and equipment checked  Epidural Patient position: sitting Prep: site prepped and draped and DuraPrep Patient monitoring: continuous pulse ox and blood pressure Approach: midline Location: L3-L4 Injection technique: LOR air  Needle:  Needle type: Tuohy  Needle gauge: 17 G Needle length: 9 cm and 9 Needle insertion depth: 7 cm Catheter type: closed end flexible Catheter size: 19 Gauge Catheter at skin depth: 12 cm Test dose: negative  Assessment Events: blood not aspirated, injection not painful, no injection resistance, negative IV test and no paresthesia  Additional Notes Patient identified. Risks/Benefits/Options discussed with patient including but not limited to bleeding, infection, nerve damage, paralysis, failed block, incomplete pain control, headache, blood pressure changes, nausea, vomiting, reactions to medication both or allergic, itching and postpartum back pain. Confirmed with bedside nurse the patient's most recent platelet count. Confirmed with patient that they are not currently taking any anticoagulation, have any bleeding history or any family history of bleeding disorders. Patient expressed understanding and wished to proceed. All questions were answered. Sterile technique was used throughout the entire procedure. Please see nursing notes for vital signs. Test dose was given through epidural needle and negative prior to continuing to dose epidural or start infusion. Warning signs of high block given to the patient including shortness of breath, tingling/numbness in hands, complete motor block, or any  concerning symptoms with instructions to call for help. Patient was given instructions on fall risk and not to get out of bed. All questions and concerns addressed with instructions to call with any issues. 1 Attempt (S) . Patient tolerated procedure well.

## 2018-01-12 NOTE — Anesthesia Postprocedure Evaluation (Signed)
Anesthesia Post Note  Patient: Ariel Andrews  Procedure(s) Performed: AN AD HOC LABOR EPIDURAL     Patient location during evaluation: Mother Baby Anesthesia Type: Epidural Level of consciousness: awake Pain management: satisfactory to patient Vital Signs Assessment: post-procedure vital signs reviewed and stable Respiratory status: spontaneous breathing Cardiovascular status: stable Anesthetic complications: no    Last Vitals:  Vitals:   01/12/18 1700 01/12/18 2142  BP: 112/71 107/61  Pulse: 78 78  Resp: 18 18  Temp: 37.4 C 36.7 C  SpO2:      Last Pain:  Vitals:   01/12/18 2144  TempSrc:   PainSc: 0-No pain   Pain Goal: Patients Stated Pain Goal: 3 (01/12/18 1844)               Cephus ShellingBURGER,Jackob Crookston

## 2018-01-12 NOTE — Anesthesia Preprocedure Evaluation (Signed)
Anesthesia Evaluation  Patient identified by MRN, date of birth, ID band Patient awake    Reviewed: Allergy & Precautions, H&P , NPO status , Patient's Chart, lab work & pertinent test results  Airway Mallampati: II  TM Distance: >3 FB Neck ROM: Full    Dental no notable dental hx. (+) Teeth Intact, Dental Advisory Given   Pulmonary neg pulmonary ROS,    Pulmonary exam normal breath sounds clear to auscultation       Cardiovascular negative cardio ROS Normal cardiovascular exam Rhythm:Regular Rate:Normal     Neuro/Psych negative neurological ROS  negative psych ROS   GI/Hepatic negative GI ROS, Neg liver ROS,   Endo/Other  negative endocrine ROS  Renal/GU negative Renal ROS     Musculoskeletal   Abdominal   Peds  Hematology   Anesthesia Other Findings   Reproductive/Obstetrics (+) Pregnancy                             Lab Results  Component Value Date   WBC 14.6 (H) 01/12/2018   HGB 12.3 01/12/2018   HCT 36.4 01/12/2018   MCV 88.8 01/12/2018   PLT 192 01/12/2018    Anesthesia Physical Anesthesia Plan  ASA: II  Anesthesia Plan: Epidural   Post-op Pain Management:    Induction:   PONV Risk Score and Plan:   Airway Management Planned:   Additional Equipment:   Intra-op Plan:   Post-operative Plan:   Informed Consent: I have reviewed the patients History and Physical, chart, labs and discussed the procedure including the risks, benefits and alternatives for the proposed anesthesia with the patient or authorized representative who has indicated his/her understanding and acceptance.     Plan Discussed with:   Anesthesia Plan Comments:         Anesthesia Quick Evaluation

## 2018-01-12 NOTE — Discharge Summary (Signed)
Postpartum Discharge Summary     Patient Name: Ariel ManifoldJessica Babich DOB: 09/15/1990 MRN: [id number]  Date of admission: 01/12/2018 Delivering Provider: Donette LarryBHAMBRI, MELANIE   Date of discharge: 01/14/2018  Admitting diagnosis: 39wks induction Intrauterine pregnancy: 762w1d     Secondary diagnosis:  Active Problems:   Encounter for planned induction of labor  Additional problems: GBS positive, Hx of PEC     Discharge diagnosis: Term Pregnancy Delivered                                                                                                Augmentation: Pitocin  Complications: None  Hospital course:  Induction of Labor With Vaginal Delivery   27 y.o. yo G2P1001 at 572w1d was admitted to the hospital 01/12/2018 for induction of labor.  Indication for induction: Postdates and Favorable cervix at term.  Patient had an uncomplicated labor course as follows: Membrane Rupture Time/Date: 1:15 PM ,01/12/2018   Intrapartum Procedures: Episiotomy: None [1]                                         Lacerations:  2nd degree [3];Perineal [11]  Patient had delivery of a Viable infant.  Information for the patient's newborn:  Prescott ParmaBethea, Boy Chara [[id number]][[id number]]  Delivery Method: Vaginal, Spontaneous(Filed from Delivery Summary)   01/12/2018  Details of delivery can be found in separate delivery note.  Patient had a routine postpartum course. Patient is discharged home 01/14/18.  Magnesium Sulfate recieved: No BMZ received: No  Physical exam  Vitals:   01/13/18 1445 01/13/18 2208 01/14/18 0602 01/14/18 0629  BP: 116/68 118/79 107/72   Pulse: 62 (!) 59 (!) 50   Resp: 16 18 16    Temp: 98 F (36.7 C) 98.6 F (37 C) 98.7 F (37.1 C) (!) 97.4 F (36.3 C)  TempSrc: Oral Oral Oral Oral  SpO2: 98%     Weight:      Height:       General: alert, cooperative and no distress Lochia: appropriate Uterine Fundus: firm Incision: N/A DVT Evaluation: No evidence of DVT seen on physical exam. Labs: Lab  Results  Component Value Date   WBC 14.6 (H) 01/12/2018   HGB 12.3 01/12/2018   HCT 36.4 01/12/2018   MCV 88.8 01/12/2018   PLT 192 01/12/2018   CMP Latest Ref Rng & Units 01/09/2018  Glucose 70 - 99 mg/dL 84  BUN 6 - 20 mg/dL 17  Creatinine 7.820.44 - 9.561.00 mg/dL 2.130.69  Sodium 086135 - 578145 mmol/L 136  Potassium 3.5 - 5.1 mmol/L 4.7  Chloride 98 - 111 mmol/L 107  CO2 22 - 32 mmol/L 17(L)  Calcium 8.9 - 10.3 mg/dL 9.3  Total Protein 6.5 - 8.1 g/dL 7.4  Total Bilirubin 0.3 - 1.2 mg/dL 4.6(N1.8(H)  Alkaline Phos 38 - 126 U/L 181(H)  AST 15 - 41 U/L 50(H)  ALT 0 - 44 U/L 38    Discharge instruction: per After Visit Summary and "Baby and Me Booklet".  After visit meds:  Allergies as of 01/14/2018   No Known Allergies     Medication List    TAKE these medications   cetirizine-pseudoephedrine 5-120 MG tablet Commonly known as:  ZYRTEC-D ALLERGY & CONGESTION Take 1 tablet by mouth 2 (two) times daily.   ibuprofen 600 MG tablet Commonly known as:  ADVIL,MOTRIN Take 1 tablet (600 mg total) by mouth every 6 (six) hours.   PRENATAL VITAMIN PLUS LOW IRON 27-1 MG Tabs Take 1 tablet by mouth daily.       Diet: routine diet  Activity: Advance as tolerated. Pelvic rest for 6 weeks.   Outpatient follow up:As scheduled for postpartum visit Follow up Appt: Future Appointments  Date Time Provider Department Center  02/09/2018  1:00 PM Allie Bossierove, Myra C, MD [facility]   Follow up Visit:   Newborn Data: Live born female  Birth Weight:   APGAR: 9, 9  Newborn Delivery   Birth date/time:  01/12/2018 13:38:00 Delivery type:  Vaginal, Spontaneous     Baby Feeding: Breast Disposition:home with mother following circumcision   01/14/2018 Catalina AntiguaPeggy Eleny Cortez, MD

## 2018-01-13 ENCOUNTER — Encounter (HOSPITAL_COMMUNITY): Payer: Self-pay

## 2018-01-13 ENCOUNTER — Other Ambulatory Visit: Payer: Self-pay

## 2018-01-13 NOTE — Lactation Note (Signed)
This note was copied from a baby's chart. Lactation Consultation Note  Patient Name: Ariel Virgel ManifoldJessica Corrigan ZOXWR'UToday's Date: 01/13/2018 Reason for consult: Initial assessment;Term Breastfeeding consultation services and support information given and reviewed.  Mom states baby is latching fairly well and she is working on obtaining more depth.  Assisted with positioning baby at breast.  Mom using cradle hold and I recommended she try cross cradle hold for more control.  Baby latched easily.  Bottom lip needed untucked with gently chin tug.  Mom is using good breast massage and compression during the feeding.  Reviewed basics and answered questions.  Encouraged to call for concerns/assist prn.  Maternal Data Has patient been taught Hand Expression?: Yes Does the patient have breastfeeding experience prior to this delivery?: Yes  Feeding Feeding Type: Breast Fed  LATCH Score Latch: Grasps breast easily, tongue down, lips flanged, rhythmical sucking.  Audible Swallowing: A few with stimulation  Type of Nipple: Everted at rest and after stimulation  Comfort (Breast/Nipple): Soft / non-tender  Hold (Positioning): Assistance needed to correctly position infant at breast and maintain latch.  LATCH Score: 8  Interventions Interventions: Assisted with latch;Breast compression;Skin to skin;Adjust position;Breast massage;Support pillows  Lactation Tools Discussed/Used     Consult Status Consult Status: Follow-up Date: 01/14/18    Huston FoleyMOULDEN, Jakyra Kenealy S 01/13/2018, 10:58 AM

## 2018-01-13 NOTE — Progress Notes (Signed)
Post Partum Day 1 Subjective: no complaints, up ad lib and tolerating PO  Objective: Blood pressure 102/77, pulse 60, temperature 97.9 F (36.6 C), temperature source Oral, resp. rate 18, height 5\' 2"  (1.575 m), weight 80.4 kg, last menstrual period 04/03/2017, SpO2 98 %, unknown if currently breastfeeding.  Physical Exam:  General: alert, cooperative and no distress Lochia: appropriate Uterine Fundus: firm Incision: n/a DVT Evaluation: No evidence of DVT seen on physical exam. Negative Homan's sign. No cords or calf tenderness. No significant calf/ankle edema.  Recent Labs    01/12/18 0749  HGB 12.3  HCT 36.4    Assessment/Plan: Plan for discharge tomorrow and Breastfeeding   LOS: 1 day   Ariel Andrews 01/13/2018, 10:24 AM

## 2018-01-14 MED ORDER — IBUPROFEN 600 MG PO TABS: 600.0000 mg | ORAL_TABLET | Freq: Four times a day (QID) | ORAL | 3 refills | Status: DC

## 2018-01-14 NOTE — Lactation Note (Signed)
This note was copied from a baby's chart. Lactation Consultation Note  Patient Name: Ariel Andrews Date: 01/14/2018 Reason for consult: Follow-up assessment Mom reports feedings are going well.  She feels she has more colostrum in left breast but baby latching to both.  Discussed milk coming to volume and the prevention and treatment of engorgement.  Instructed to continue to feed with cues.  Mom is interested in renting a breast pump at discharge.  Information given.  Reviewed and encouraged lactation outpatient services and support.  Maternal Data    Feeding Feeding Type: Breast Fed  LATCH Score                   Interventions    Lactation Tools Discussed/Used     Consult Status Consult Status: Complete Follow-up type: Call as needed    Huston Foley 01/14/2018, 9:37 AM

## 2018-01-15 ENCOUNTER — Encounter (HOSPITAL_COMMUNITY): Payer: 59

## 2018-01-23 ENCOUNTER — Ambulatory Visit: Payer: Self-pay

## 2018-01-23 NOTE — Lactation Note (Signed)
This note was copied from a baby'Andrews chart. 01/23/2018  Name: Ariel Andrews MRN: [id number] Date of Birth: 01/12/2018 Gestational Age: Gestational Age: 4332w1d Birth Weight: 146 oz Weight today:    8 pounds 5.7 ounces (3790 grams) with clean newborn diaper  5311 day old infant presents today and mom for feeding assessment. Mom is concerned with infant not gaining weight.   Infant is 74 grams below discharge weight on 1/1 and 350 grams below birth weight.   Infant breast fed on both breasts for about 10-15 minutes. Infant transferred a total of 24 ml from mom and then was supplemented with Tommie Tippee Bottle and tolerated it well. Mom as dad have been feeding infant with a luer lock syringe and allowing infant to latch onto the luer lock area and pushing to milk into infant. Mom reports infant drooled a lot on the syringe.  LC advised mom that is not a safe way to feed infant. Mom is concerned that if infant gets bottles he will not BF just as her older child did. Discussed importance of using a slower flow nipple and paced bottle feeding. Mom ordered Tommie Tippee Extra Slow Flow nipples to go with her bottles while in the office. Reviewed that caloric intake is extremely important in this infant at this time.   We used a 5 french feeding tube at the breast and a curved tip syringe and infant did not form a good seal and transfer very well from either. Decision was made for infant to be supplemented with a bottle at this time. Mom was agreeable and feels dad will like this better.   Mom is pumping every 2-3 hours post BF and getting 1-2 ounces that is being fed to infant. They did give him 2 ounces of formula last night also. Mom prefers not to use formula but is aware that infant needs formula if infant is not getting enough breast milk.   Mom reports infant is sleepy at the breast. Infant is noted to have a recessed chin and a upper lip restriction. Infant has several indications of a  posterior tongue restriction. Discussed with mom that we need to focus on getting infant calories up for now and will reassess tongue restriction at next appt, mom asked LC if infant had a tongue or lip tie. She has been reading and became concerned since infant not gaining well. Mom was given a resource sheet to research tongue and lip restrictions and how they can effect milk supply.    Mom is to focus on limiting time at the breast temporarily until infant transferring better to preserve infant calories. Pumping to supplement infant and increasing her milk supply. Mom aware to use formula if not enough breast milk to feed infant.   Impression is that infant is calorie deficient and milk supply has decreased over time due to poor transfer. Mom has asymmetrical breasts with right breast being smaller in size than the left breast. Reviewed Galactagogues, relaxation with pumping, hands on pumping, fluids and calories in mom.   Infant to follow up with Lactation next week. Infant to follow up with Dr[facility] Wil[facility]Herber on 1[facility].   Weight [facility] Nurse Line [facility].      General Information: Mother'Andrews reason for visit: Feeding assessment, not gaining weight Consult: Initial Lactation consultant: Ariel Andrews,Ariel Andrews Breastfeeding experience: Latches with each feeding, supplements with syringe   Maternal medications: Pre-natal vitamin, Tylenol (acetaminophen), Stool softener, Other(Blessed  thistle)  Breastfeeding History: Frequency of breast feeding: every 2-5 hours Duration of feeding: 15-60 minutes  Supplementation: Supplement method: syringe(Luer Corporate treasurer) Brand: Daron Offer Formula volume: 2 ounces Formula frequency: 1 x a day Total formula volume per day: 2 ounces, started yesterday Breast milk volume: 1-2 ounces Breast milk frequency: 7-8 x a day Total breast milk volume per day: 7-16 ounces Pump type: Symphony Pump frequency:  every 2-3 hours usually Pump volume: 1-2 ounces  Infant Output Assessment: Voids per 24 hours: 5-6 Urine color: Clear yellow Stools per 24 hours: 2-3 large and several smaller than quarter Stool color: Yellow  Breast Assessment: Breast: Soft, Compressible, Asymmetrical(right smaller than the left) Nipple: Erect, Scabs Pain level: 6(6-7 with latch, better after latch) Pain interventions: Bra, Coconut oil, Sore nipple shells, Expressed breast milk  Feeding Assessment: Infant oral assessment: Variance Infant oral assessment comment: Infant with thick labial frenulum that inserts at the bottom of the gum ridge with a small divot to gum ridge. Upper lip blanches with flanging. infant has difficulty maintaining a seal on finger and bottle. infant disorganized with suck with initial suckling with some tongue thrusting. mom iwth scabs to nipple and nipple sligjtly compressed post feeding.  Positioning: Cross cradle(right breast, 10 minutes, sleepy) Latch: 1 - Repeated attempts needed to sustain latch, nipple held in mouth throughout feeding, stimulation needed to elicit sucking reflex. Audible swallowing: 1 - A few with stimulation Type of nipple: 2 - Everted at rest and after stimulation Comfort: 1 - Filling, red/small blisters or bruises, mild/mod discomfort Hold: 1 - Assistance needed to correctly position infant at breast and maintain latch LATCH score: 6 Latch assessment: Deep Lips flanged: Yes Suck assessment: Displays both   Pre-feed weight: 3790 grams Post feed weight: 3796 grams Amount transferred: 6 ml Amount supplemented: 0  Additional Feeding Assessment: Infant oral assessment: Variance Infant oral assessment comment: see above Positioning: Cross cradle(left breast, 15 minutes) Latch: 2 - Grasps breast easily, tongue down, lips flanged, rhythmical sucking. Audible swallowing: 2 - Spontaneous and intermittent Type of nipple: 2 - Everted at rest and after  stimulation Comfort: 1 - Filling, red/small blisters or bruises, mild/mod discomfort Hold: 1 - Assistance needed to correctly position infant at breast and maintain latch LATCH score: 8 Latch assessment: Deep Lips flanged: No(upper lip needs flanging) Suck assessment: Displays both Tools: Syringe with 5 Fr feeding tube, Bottle Pre-feed weight: 3796 grams Post feed weight: 3824 grams Amount transferred: 18 ml Amount supplemented: 10  Totals: Total amount transferred: 24 ml Total supplement given: 10 wiht 5 french feeding tube at breast and then 60 ml in the bottle Total amount pumped post feed: did not pump, ran out of time at the appointment.  Plan:  1. Offer infant the breast with feeding cues. Limit breast feeding to 20-30 minutes if he is sleepy at the breast 2. Keep infant awake at feeding as needed 3. Massage/compress breast with feeding 4. Offer infant a bottle after each breast feeding with breast milk first and then formula if do not have enough breast milk. Make sure infant gets at least 2 ounces of supplement with each feeding. He can have more if he wants it. Feed him until he is satisfied.  5. Feed infant using the Tommie Tippee Extra Slow Flow nipple 6. Use the Paced Bottle Feeding Method for feeding infant (video on kellymom.com) 7. Infant needs about 72-95 ml (2.5-3 ounces) for 8 feedings a day or 570-760 (19-25 ounces) in 24 hours. Infant may  take more or less depending on how often he feeds. Feed infant until he is satisfied.  8. Continue pumping with your Double Electric breast pump about 8 times in 24 hours to protect your milk supply 9. Fenugreek may be helpful to help increase your milk supply. Dosage per orange sheet 10. Keep up the good work 11. Call with any questions/concerns as needed [phone] 12. Thank you for allowing me to assist you today 13. Follow up with Lactation in 1 week 3:30 pm on Jan 15  [facility] Andrews  Andrews,  Ariel Andrews                                                      Ariel Andrews  01/23/2018, 9:19 AM

## 2018-01-28 ENCOUNTER — Ambulatory Visit: Payer: Self-pay

## 2018-01-28 NOTE — Lactation Note (Signed)
This note was copied from a baby's chart. 01/28/2018  Name: Ariel Andrews MRN: [id number] Date of Birth: 01/12/2018 Gestational Age: Gestational Age: [redacted]w[redacted]d Birth Weight: 146 oz Weight today:    8 pounds 14.3 ounces (4036 grams) in clean newborn diaper  54 day old term infant infant presents today for follow up feeding assessment. Mom reports she has stopped BF and is pumping and bottle feeding. Mom reports infant started fighting the breast and she was getting too upset with feedings. Mom is debating on pumping and bottle feeding.   Infant has gained 246 grams in the last 5 days with an average daily weight gain of 49 grams a day. Infant with 3 voids in the office today.   Mom reports infant was taking the bottle slowly and was sleepy with feedings and then Saturday night he went through a cluster feeding spell and has been eating better since.   Mom is pumping about 8 x a day and is getting about 2 ounces per pumping. Mom is taking Fenugreek and has noticed an increase in her supply. Infant is being fed at least every 3 hours on demand. Infant getting 2/3 EBM and 1/3 formula.   Infant latched to the breast easily. Mom did well with supporting and latching him independently. Mom flanged upper and lower lips after latch. Mom denied pain with feedings. Nipples are compressed post feeding.   Infant finished feeding with Tommie Tippee Extra Slow flow nipple and tolerated it well.   Infant to follow up with Dr. Wilmer Floor tomorrow. Mom aware of BF Support Groups. Follow up with Lactation as needed or 1-5 days post tongue/lip releases if completed.  Infant weight called into Toniann Fail at Dr. Cindee Lame Office.    General Information: Mother's reason for visit: Follow up feeding assessment, weight check Consult: Follow-up Lactation consultant: Ariel Stain RN,IBCLC Breastfeeding experience: not latching to the breast, pumping and bottle feeding   Maternal medications: Pre-natal vitamin,  Other(Fenugreek 3 capsules TID)  Breastfeeding History: Frequency of breast feeding: not latching Duration of feeding: 0  Supplementation: Supplement method: bottle(Tommie Tippee Extra Slow Flow Nipple) Brand: Daron Offer Formula volume: 1 ounce Formula frequency: 8 x a day Total formula volume per day: 8 ounces Breast milk volume: 2 ounces Breast milk frequency: 8 x a day Total breast milk volume per day: 14 ounces Pump type: Symphony Pump frequency: 7-8 x a day Pump volume: 1.5-2.5 ounces  Infant Output Assessment: Voids per 24 hours: 7-8 Urine color: Clear yellow Stools per 24 hours: 2-3  Stool color: Yellow  Breast Assessment: Breast: Soft, Compressible, Asymmetrical Nipple: Erect Pain level: 4(0 when not nursing) Pain interventions: Bra, Coconut oil, Sore nipple shells, Expressed breast milk  Feeding Assessment: Infant oral assessment: Variance Infant oral assessment comment: Infant with thick labial frenulum that inserts at the bottom of the gum ridge. Upper lip with some tension with flanging. Infant with sucking blister on center of upper lip. Infant with high palate and recessed chin. Infant with posterior lingual frenulum noted, infant with some decreased mid tongue elevation. Infant with good tongue extension and lateralization. Infant chomping and biting some on gloved finger. Nipples are compressed post feeding. Mom is not experiencing nipple pain with feedings today. Parents plan to have infant evaluated by oral specialist.  Positioning: Cross cradle(right breast) Latch: 2 - Grasps breast easily, tongue down, lips flanged, rhythmical sucking. Audible swallowing: 1 - A few with stimulation Type of nipple: 2 - Everted at rest and after stimulation Comfort: 1 -  Filling, red/small blisters or bruises, mild/mod discomfort Hold: 2 - No assistance needed to correctly position infant at breast LATCH score: 8 Latch assessment: Deep Lips flanged: No(upper lip needs  flanging) Suck assessment: Displays both   Pre-feed weight: 4036 grams Post feed weight: 4042 grams Amount transferred: 6 ml Amount supplemented: 0  Additional Feeding Assessment: Infant oral assessment: Variance Infant oral assessment comment: see above Positioning: Cross cradle(left breast, 10 minutes) Latch: 2 - Grasps breast easily, tongue down, lips flanged, rhythmical sucking. Audible swallowing: 1 - A few with stimulation Type of nipple: 2 - Everted at rest and after stimulation Comfort: 1 - Filling, red/small blisters or bruises, mild/mod discomfort Hold: 2 - No assistance needed to correctly position infant at breast LATCH score: 8 Latch assessment: Deep Lips flanged: No(upper lip needs flanging) Suck assessment: Displays both   Pre-feed weight: 4042 grams Post feed weight: 4054 grams Amount transferred: 12 ml Amount supplemented: 60 ml EBM via Tommie Tippee Extra Slow Flow Nipple + 10 ml Formula  Totals: Total amount transferred: 18 ml Total supplement given: 60 ml EBM via Tommie Tippee Extra Slow Flow Nipple + 10 ml Formula Total amount pumped post feed: did not pump   Plan:  1. Offer infant the breast with feeding cues as mom and infant desire 2. Keep infant awake at feeding as needed 3. Massage/compress breast with feeding 4. Offer infant a bottle after each breast feeding with breast milk and/or formula after breast feeding 5. Feed infant using the Tommie Tippee Extra Slow Flow nipple 6. Use the Paced Bottle Feeding Method for feeding infant (video on [email or web address].com) 7. Infant needs about 75-100 ml (2.5-3.5 ounces) for 8 feedings a day or 600-800 (20-26 ounces) in 24 hours. Infant may take more or less depending on how often he feeds. Feed infant until he is satisfied.  8. Continue pumping with your Double Electric breast pump about 8 times in 24 hours to protect your milk supply 9. Fenugreek may be helpful to help increase your milk supply. Dosage per orange  sheet 10. Keep up the good work 11. Call with any questions/concerns as needed [phone] 12. Thank you for allowing me to assist you today 13. Follow up with Lactation as needed or 1-5 days post tongue/lip releases if completed  [facility] RN, IBCLC                                                      Ed Blalock 01/28/2018, 3:35 PM

## 2018-02-04 ENCOUNTER — Telehealth (HOSPITAL_COMMUNITY): Payer: Self-pay | Admitting: Lactation Services

## 2018-02-04 ENCOUNTER — Telehealth: Payer: Self-pay | Admitting: *Deleted

## 2018-02-04 NOTE — Telephone Encounter (Signed)
Left patient an URGENT message that the office will need to reschedule her appointment on 02/09/2018 at 1:00pm for an earlier time the same day or another day due to NO PROVIDER to cover that afternoon for Dr. Marice Potter.

## 2018-02-04 NOTE — Telephone Encounter (Signed)
Returned mom's call. Mom is concerned with milk supply..   Mom feels infant is feeding well and is gaining. Mom is planning to take to [facility] for follow up weight check, they are going to call her back in the morning. Infant to be seen on Feb. 2nd for one month check up.   Mom is mostly pumping and bottle feeding. She is concerned that even if she gets infant tongue and lip revised that she may not be able to get her milk supply up. Mom is questioning whether she wants to have infant evaluated by oral specialist. Enc mom to only go and have infant evaluated if she and dad feel like it is the best thing for their child. Mom is concerned about spending a lot of money and then milk supply does not increase and she still needs to give formula, discussed this may be a possibility.   Infant is feeding about 2-4 ounces per feeding. Mom is making almost enough to feed infant exclusively. Mom asking how to increase supply. Mom is taking Fenugreek and plans to also get Moringa to try. Discussed Reglan may be an option, mom is not interested in at this time.   Mom asking when infant can sleep through the night, discussed when infant is ready he can if he is growing well. Discussed it is not recommended for mom to go all night without pumping as it may decrease her milk supply.   Mom voiced understanding to all the above. Mom to call back with questions as needed.

## 2018-02-09 ENCOUNTER — Ambulatory Visit: Payer: 59 | Admitting: Obstetrics & Gynecology

## 2018-02-10 ENCOUNTER — Encounter: Payer: Self-pay | Admitting: Advanced Practice Midwife

## 2018-02-10 ENCOUNTER — Ambulatory Visit (INDEPENDENT_AMBULATORY_CARE_PROVIDER_SITE_OTHER): Payer: 59 | Admitting: Advanced Practice Midwife

## 2018-02-10 DIAGNOSIS — K5900 Constipation, unspecified: Secondary | ICD-10-CM

## 2018-02-10 DIAGNOSIS — Z3009 Encounter for other general counseling and advice on contraception: Secondary | ICD-10-CM

## 2018-02-10 MED ORDER — HYDROCORTISONE ACE-PRAMOXINE 1-1 % RE FOAM: 1.0000 | Freq: Two times a day (BID) | RECTAL | 0 refills | Status: DC

## 2018-02-10 NOTE — Progress Notes (Signed)
Post Partum Exam  Ariel Andrews is a 28 y.o. G89P2002 female who presents for a postpartum visit. She is 4 weeks postpartum following a spontaneous vaginal delivery. I have fully reviewed the prenatal and intrapartum course. The delivery was at 40w 1d gestational weeks.  Anesthesia: epidural. Postpartum course has been unremarkable. Baby's course has been unremarkable. Baby is feeding by breast pt is pumping and feeding via bottle due to tongue tie. Bleeding none. Bowel function is normal. Bladder function is normal. Patient has not been  sexually active. Contraception method is undure. Postpartum depression screening:neg  The following portions of the patient's history were reviewed and updated as appropriate: allergies, current medications, past family history, past medical history, past social history, past surgical history and problem list. Last pap smear done n/a. Pt declined due to difficulty with exams.   Discussed today and pt considering.  Review of Systems Pertinent items noted in HPI and remainder of comprehensive ROS otherwise negative.    Objective:  unknown if currently breastfeeding.  VS reviewed, nursing note reviewed,  Constitutional: well developed, well nourished, no distress HEENT: normocephalic CV: normal rate Pulm/chest wall: normal effort Breast Exam: Deferred Abdomen: soft Neuro: alert and oriented x 3 Skin: warm, dry Psych: affect normal Pelvic exam: visual inspection shows laceration well approximated, no edema, mild erythema only, no visible or palpable sutures.   Rectal exam wnl      Assessment/Plan:   1. Postpartum examination following vaginal delivery --Doing well, laceration healing.  2. Encounter for counseling regarding contraception --Discussed LARCs as most effective forms of birth control.  Discussed benefits/risks of other methods.  Pt undecided.  Programme researcher, broadcasting/film/video given for pt to take home.  Pt to f/u PRN.    3.  Constipation  --Pain with  last bowel movement, continues to have some sharp pain at outlet.   --? Fissure, none seen on exam --Resume stool softeners --Rx for Proctofoam, pt may use topical OTC medications/witch hazel  Social/emotional concerns:  None.  Contraception: undecided  Follow up in: 1 year or as needed for contraception.   Sharen Counter, CNM 3:57 PM

## 2018-02-10 NOTE — Progress Notes (Signed)
Subjective:     Ariel Andrews is a 28 y.o. female who presents for a postpartum visit. She is 4 weeks postpartum following a spontaneous vaginal delivery. I have fully reviewed the prenatal and intrapartum course. The delivery was at [redacted]w[redacted]d gestational weeks. Outcome: spontaneous vaginal delivery. Anesthesia: epidural. Postpartum course has been uneventful with mild, intermittent pain at her laceration site and some mild constipation since stopping her colace. Baby's course has been uncomplicated.  He has a tongue tie and has had difficulty feeding, but patient is pumping and bottlefeeding and plans to have tongue-tie repaired when insurance updates in a few weeks. Baby is feeding by breast. Bleeding staining only. Bowel function is normal with mild constipation after stopping her colace. Bladder function is normal. Patient is not sexually active. Contraception method is abstinence at this time. Patient unsure of which method she would like to use for contraception.. Postpartum depression screening: negative.  The following portions of the patient's history were reviewed and updated as appropriate: allergies, current medications, past family history, past medical history, past social history, past surgical history and problem list.  Review of Systems Pertinent items noted in HPI and remainder of comprehensive ROS otherwise negative.   Objective:    BP 107/65   Pulse 82   Resp 16   Ht 5\' 2"  (1.575 m)   Wt 156 lb (70.8 kg)   Breastfeeding Yes   BMI 28.53 kg/m   General:  alert, cooperative, appears stated age and no distress   Breasts:  inspection negative, no nipple discharge or bleeding, no masses or nodularity palpable  Lungs: clear to auscultation bilaterally  Heart:  regular rate and rhythm, S1, S2 normal, no murmur, click, rub or gallop and normal apical impulse  Abdomen: soft, non-tender; bowel sounds normal; no masses,  no organomegaly   Vulva:  normal  Vagina: vagina positive for  erythema and healing laceration  Cervix:  not examined  Corpus: not examined  Adnexa:  not evaluated  Rectal Exam: Visually intact with no hemorrhoids noted.        Assessment:     Normal postpartum exam. Pap smear not done at today's visit.   Plan:    1. Contraception: Currently using abstinance.  Unsure about future contraception.  Will use condoms while deciding.  Disucussed different options available.  Will read more on IUD and nexplanon and decide what she would like to use. 2. Follow up in: 1 year for well woman exam or as needed.    Louis Matte Earlene Plater, SNP

## 2018-02-10 NOTE — Progress Notes (Deleted)
Post Partum Exam  Ariel Andrews is a 28 y.o. G63P2002 female who presents for a postpartum visit. She is 4 weeks postpartum following a spontaneous vaginal delivery. I have fully reviewed the prenatal and intrapartum course. The delivery was at 40w 1d gestational weeks.  Anesthesia: epidural. Postpartum course has been unremarkable. Baby's course has been unremarkable. Baby is feeding by breast pt is pumping and feeding via bottle due to tongue tie.. Bleeding none. Bowel function is normal. Bladder function is normal. Patient has not been  sexually active. Contraception method is undure. Postpartum depression screening:neg  {Common ambulatory SmartLinks:19316} Last pap smear done *** and was {Desc; normal/abnormal:11317::"Normal"}  Review of Systems {ros; complete:30496}    Objective:  unknown if currently breastfeeding.  General:  {gen appearance:16600}   Breasts:  {breast exam:1202::"inspection negative, no nipple discharge or bleeding, no masses or nodularity palpable"}  Lungs: {lung exam:16931}  Heart:  {heart exam:5510}  Abdomen: {abdomen exam:16834}   Vulva:  {labia exam:12198}  Vagina: {vagina exam:12200}  Cervix:  {cervix exam:14595}  Corpus: {uterus exam:12215}  Adnexa:  {adnexa exam:12223}  Rectal Exam: {rectal/vaginal exam:12274}        Assessment:    *** postpartum exam. Pap smear {done:10129} at today's visit.   Plan:   1. Contraception: {method:5051} 2. *** 3. Follow up in: {1-10:13787} {time; units:19136} or as needed.

## 2018-02-26 ENCOUNTER — Encounter: Payer: Self-pay | Admitting: *Deleted

## 2018-03-27 ENCOUNTER — Ambulatory Visit: Payer: 59

## 2018-10-01 ENCOUNTER — Telehealth: Payer: Self-pay | Admitting: *Deleted

## 2018-10-01 NOTE — Telephone Encounter (Signed)
Left patient a message to have [facility] send a medical release form to release records to them on the patient's behalf or the patient can try to release her records through her [facility] to them.

## 2019-07-28 ENCOUNTER — Other Ambulatory Visit: Payer: Self-pay

## 2019-07-28 ENCOUNTER — Other Ambulatory Visit (HOSPITAL_COMMUNITY)
Admission: RE | Admit: 2019-07-28 | Discharge: 2019-07-28 | Disposition: A | Discharge status: Home / Self Care | Payer: No Typology Code available for payment source | Source: Ambulatory Visit | Attending: Obstetrics and Gynecology | Admitting: Obstetrics and Gynecology

## 2019-07-28 ENCOUNTER — Encounter: Payer: Self-pay | Admitting: Obstetrics and Gynecology

## 2019-07-28 ENCOUNTER — Ambulatory Visit (INDEPENDENT_AMBULATORY_CARE_PROVIDER_SITE_OTHER): Payer: Self-pay | Admitting: Obstetrics and Gynecology

## 2019-07-28 VITALS — BP 110/55 | HR 87 | Resp 16 | Ht 62.0 in | Wt 150.0 lb

## 2019-07-28 DIAGNOSIS — Z01419 Encounter for gynecological examination (general) (routine) without abnormal findings: Secondary | ICD-10-CM | POA: Insufficient documentation

## 2019-07-28 DIAGNOSIS — N926 Irregular menstruation, unspecified: Secondary | ICD-10-CM

## 2019-07-28 NOTE — Progress Notes (Signed)
GYNECOLOGY ANNUAL PREVENTATIVE CARE ENCOUNTER NOTE  History:     Ariel Andrews is a 29 y.o. G71P2002 female here for a routine annual gynecologic exam.  Current complaints: abnormal menstrual cycles, and anxiety at the time of ovulation only. This all started after her last baby was born, roughly 6 months after delivery. No history of postpartum hemorrhage.  Mom has elevated estrogen levels and she is concerned she does as well.  No prior history of heavy periods.    Denies abnormal vaginal bleeding, discharge, pelvic pain, problems with intercourse or other gynecologic concerns.    Gynecologic History Patient's last menstrual period was 06/28/2019. Contraception: none Last Pap: NA. First pap today  Last mammogram: NA  Obstetric History OB History  Gravida Para Term Preterm AB Living  2 2 2     2   SAB TAB Ectopic Multiple Live Births        0 2    # Outcome Date GA Lbr Len/2nd Weight Sex Delivery Anes PTL Lv  2 Term 01/12/18 [redacted]w[redacted]d 02:21 / 00:17 9 lb 2 oz (4.14 kg) M Vag-Spont EPI  LIV  1 Term 07/06/15 [redacted]w[redacted]d 02:20 / 03:26 8 lb 9.6 oz (3.9 kg) F Vag-Spont EPI  LIV    Past Medical History:  Diagnosis Date  . Medical history non-contributory     Past Surgical History:  Procedure Laterality Date  . CYST EXCISION      Current Outpatient Medications on File Prior to Visit  Medication Sig Dispense Refill  . cetirizine-pseudoephedrine (ZYRTEC-D ALLERGY & CONGESTION) 5-120 MG tablet Take 1 tablet by mouth 2 (two) times daily. 60 tablet 1  . ibuprofen (ADVIL,MOTRIN) 600 MG tablet Take 1 tablet (600 mg total) by mouth every 6 (six) hours. 30 tablet 3  . Multiple Vitamin (MULTIVITAMIN) tablet Take 1 tablet by mouth daily.     No current facility-administered medications on file prior to visit.    No Known Allergies  Social History:  reports that she has never smoked. She uses smokeless tobacco. She reports that she does not drink alcohol and does not use drugs.  Family History    Problem Relation Age of Onset  . Colon polyps Mother   . Colon cancer Maternal Grandfather   . Heart disease Maternal Grandfather     The following portions of the patient's history were reviewed and updated as appropriate: allergies, current medications, past family history, past medical history, past social history, past surgical history and problem list.  Review of Systems Pertinent items noted in HPI and remainder of comprehensive ROS otherwise negative.  Physical Exam:  BP (!) 110/55   Pulse 87   Resp 16   Ht 5\' 2"  (1.575 m)   Wt 150 lb (68 kg)   LMP 06/28/2019   Breastfeeding No   BMI 27.44 kg/m  CONSTITUTIONAL: Well-developed, well-nourished female in no acute distress.  HENT:  Normocephalic, atraumatic, External right and left ear normal. Oropharynx is clear and moist EYES: Conjunctivae and EOM are normal. Pupils are equal, round, and reactive to light. No scleral icterus.  NECK: Normal range of motion, supple, no masses.  Normal thyroid.  SKIN: Skin is warm and dry. No rash noted. Not diaphoretic. No erythema. No pallor. MUSCULOSKELETAL: Normal range of motion. No tenderness.  No cyanosis, clubbing, or edema.  2+ distal pulses. NEUROLOGIC: Alert and oriented to person, place, and time. Normal reflexes, muscle tone coordination.  PSYCHIATRIC: Normal mood and affect. Normal behavior. Normal judgment and thought content. CARDIOVASCULAR:  Normal heart rate noted, regular rhythm RESPIRATORY: Clear to auscultation bilaterally. Effort and breath sounds normal, no problems with respiration noted. BREASTS: Symmetric in size. No masses, tenderness, skin changes, nipple drainage, or lymphadenopathy bilaterally. Performed in the presence of a chaperone. ABDOMEN: Soft, no distention noted.  No tenderness, rebound or guarding.  PELVIC: Normal appearing external genitalia and urethral meatus; normal appearing vaginal mucosa and cervix.  No abnormal discharge noted.  Pap smear obtained.   Normal uterine size, no other palpable masses, no uterine or adnexal tenderness.  Performed in the presence of a chaperone.   Assessment and Plan:   1. Women's annual routine gynecological examination  - Cytology - PAP( [place]) - TSH  2. Menstrual cycle problem  - Estrogens, Total - TSH  Will follow up results of pap smear and manage accordingly. Routine preventative health maintenance measures emphasized. Please refer to After Visit Summary for other counseling recommendations.     Daana Petrasek, Harolyn Rutherford, NP [facility] [employer], [facility]

## 2019-07-30 ENCOUNTER — Ambulatory Visit: Payer: 59

## 2019-07-30 LAB — CYTOLOGY - PAP: Diagnosis: NEGATIVE

## 2019-08-18 ENCOUNTER — Encounter: Payer: Self-pay | Admitting: *Deleted

## 2019-08-18 LAB — ESTROGENS, TOTAL: Estrogen: 378.1 pg/mL

## 2019-08-18 LAB — TSH: TSH: 2.86 mIU/L

## 2020-02-25 ENCOUNTER — Ambulatory Visit: Payer: No Typology Code available for payment source | Admitting: Medical-Surgical

## 2021-12-03 ENCOUNTER — Encounter: Payer: Self-pay | Admitting: *Deleted

## 2021-12-03 DIAGNOSIS — Z3480 Encounter for supervision of other normal pregnancy, unspecified trimester: Secondary | ICD-10-CM | POA: Insufficient documentation

## 2021-12-03 NOTE — Progress Notes (Unsigned)
Last pap 2021

## 2021-12-04 ENCOUNTER — Other Ambulatory Visit (HOSPITAL_COMMUNITY)
Admission: RE | Admit: 2021-12-04 | Discharge: 2021-12-04 | Disposition: A | Discharge status: Home / Self Care | Payer: Medicaid Other | Source: Ambulatory Visit | Attending: Certified Nurse Midwife | Admitting: Certified Nurse Midwife

## 2021-12-04 ENCOUNTER — Encounter: Payer: Self-pay | Admitting: Certified Nurse Midwife

## 2021-12-04 ENCOUNTER — Ambulatory Visit (INDEPENDENT_AMBULATORY_CARE_PROVIDER_SITE_OTHER): Payer: Medicaid Other | Admitting: Certified Nurse Midwife

## 2021-12-04 VITALS — BP 112/77 | HR 67 | Wt 168.0 lb

## 2021-12-04 DIAGNOSIS — Z3480 Encounter for supervision of other normal pregnancy, unspecified trimester: Secondary | ICD-10-CM

## 2021-12-04 DIAGNOSIS — O0932 Supervision of pregnancy with insufficient antenatal care, second trimester: Secondary | ICD-10-CM | POA: Diagnosis not present

## 2021-12-04 DIAGNOSIS — Z3482 Encounter for supervision of other normal pregnancy, second trimester: Secondary | ICD-10-CM

## 2021-12-04 DIAGNOSIS — O09292 Supervision of pregnancy with other poor reproductive or obstetric history, second trimester: Secondary | ICD-10-CM | POA: Diagnosis not present

## 2021-12-04 DIAGNOSIS — Z3A20 20 weeks gestation of pregnancy: Secondary | ICD-10-CM

## 2021-12-04 DIAGNOSIS — O09299 Supervision of pregnancy with other poor reproductive or obstetric history, unspecified trimester: Secondary | ICD-10-CM

## 2021-12-04 DIAGNOSIS — O0930 Supervision of pregnancy with insufficient antenatal care, unspecified trimester: Secondary | ICD-10-CM

## 2021-12-04 MED ORDER — ASPIRIN 81 MG PO CHEW: 81.0000 mg | CHEWABLE_TABLET | Freq: Every day | ORAL | 3 refills | Status: DC

## 2021-12-04 NOTE — Progress Notes (Signed)
Subjective:   Ariel Andrews is a 31 y.o. G3P2002 at [redacted]w[redacted]d by LMP being seen today for her first obstetrical visit.  Her obstetrical history is significant for macrosomia and pre-eclampsia. Patient does intend to breast feed. Pregnancy history fully reviewed.  Patient reports no complaints.  HISTORY: OB History  Gravida Para Term Preterm AB Living  3 2 2  0 0 2  SAB IAB Ectopic Multiple Live Births  0 0 0 0 2    # Outcome Date GA Lbr Len/2nd Weight Sex Delivery Anes PTL Lv  3 Current           2 Term 01/12/18 [redacted]w[redacted]d 02:21 / 00:17 9 lb 2 oz (4.14 kg) M Vag-Spont EPI  LIV     Name: Rathke,BOY Tyese     Apgar1: 9  Apgar5: 9  1 Term 07/06/15 [redacted]w[redacted]d 02:20 / 03:26 8 lb 9.6 oz (3.9 kg) F Vag-Spont EPI  LIV     Name: Vantuyl,GIRL Rokhaya     Apgar1: 9  Apgar5: 9   Past Medical History:  Diagnosis Date   Anxiety    Medical history non-contributory    Past Surgical History:  Procedure Laterality Date   CYST EXCISION     Family History  Problem Relation Age of Onset   Colon polyps Mother    Colon cancer Maternal Grandfather    Heart disease Maternal Grandfather    Social History   Tobacco Use   Smoking status: Never   Smokeless tobacco: Current  Vaping Use   Vaping Use: Never used  Substance Use Topics   Alcohol use: No    Alcohol/week: 0.0 standard drinks of alcohol   Drug use: No   No Known Allergies Current Outpatient Medications on File Prior to Visit  Medication Sig Dispense Refill   Prenatal Vit-Fe Fumarate-FA (PRENATAL VITAMIN PO) Take by mouth.     sertraline (ZOLOFT) 50 MG tablet Take by mouth.     No current facility-administered medications on file prior to visit.     Indications for ASA therapy (per uptodate) One of the following: Previous pregnancy with preeclampsia, especially early onset and with an adverse outcome Yes Multifetal gestation No Chronic hypertension No Type 1 or 2 diabetes mellitus No Chronic kidney disease No Autoimmune disease  (antiphospholipid syndrome, systemic lupus erythematosus) No   Two or more of the following: Nulliparity No Obesity (body mass index >30 kg/m2) No Family history of preeclampsia in mother or sister No Age ?35 years No Sociodemographic characteristics (African American race, low socioeconomic level) No Personal risk factors (eg, previous pregnancy with low birth weight or small for gestational age infant, previous adverse pregnancy outcome [eg, stillbirth], interval >10 years between pregnancies) No   Indications for early 1 hour GTT (per uptodate)  BMI >25 (>23 in Asian women) AND one of the following  Gestational diabetes mellitus in a previous pregnancy No Glycated hemoglobin ?5.7 percent (39 mmol/mol), impaired glucose tolerance, or impaired fasting glucose on previous testing No First-degree relative with diabetes No High-risk race/ethnicity (eg, African American, Latino, Native American, [place] American, Pacific Islander) No History of cardiovascular disease No Hypertension or on therapy for hypertension No High-density lipoprotein cholesterol level <35 mg/dL (0.90 mmol/L) and/or a triglyceride level >250 mg/dL (2.82 mmol/L) No Polycystic ovary syndrome No Physical inactivity No Other clinical condition associated with insulin resistance (eg, severe obesity, acanthosis nigricans) No Previous birth of an infant weighing ?4000 g Yes Previous stillbirth of unknown cause No   Exam  Vitals:   12/04/21 1307  BP: 112/77  Pulse: 67  Weight: 168 lb (76.2 kg)   Fetal Heart Rate (bpm): 147  Uterus:     Pelvic Exam:                        System: General: well-developed, well-nourished female in no acute distress   Breast:  normal appearance, no masses or tenderness   Skin: normal coloration and turgor, no rashes   Neurologic: oriented, normal, negative, normal mood   Extremities: normal strength, tone, and muscle mass, ROM of all joints is normal   HEENT PERRLA,  extraocular movement intact and sclera clear, anicteric   Mouth/Teeth mucous membranes moist, pharynx normal without lesions and dental hygiene good   Neck supple and no masses   Cardiovascular: regular rate and rhythm   Respiratory:  no respiratory distress, normal breath sounds   Abdomen: soft, non-tender; bowel sounds normal; no masses,  no organomegaly     Assessment:   Pregnancy: [id number] Patient Active Problem List   Diagnosis Date Noted   History of pre-eclampsia in prior pregnancy, currently pregnant in second trimester 12/04/2021   History of macrosomia in infant in prior pregnancy, currently pregnant 12/04/2021   Late prenatal care 12/04/2021   Supervision of other normal pregnancy, antepartum 12/03/2021   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 Supervision of other normal pregnancy, antepartum - Culture, OB Urine - Hepatitis C antibody - HIV Antibody (routine testing w rflx) - Obstetric panel - [place] MFM OB COMP + 14 WK; Future - Cervicovaginal ancillary only( [place]) - [place] MFM OB DETAIL +14 WK; Future - Babyscripts Schedule Optimization  2. History of pre-eclampsia in prior pregnancy, currently pregnant in second trimester - aspirin 81 MG chewable tablet; Chew 1 tablet (81 mg total) by mouth daily. Start after 12 weeks  Dispense: 60 tablet; Refill: 3  3. History of macrosomia in infant in prior pregnancy, currently pregnant - early A1C  4. [redacted] weeks gestation of pregnancy  5. Late prenatal care   Initial labs drawn. Started on ASA -yes Flu vax no Continue prenatal vitamins. Genetic Screening discussed, First trimester screen, Quad screen, and NIPS: declined. Ultrasound discussed; fetal anatomic survey: requested. Problem list reviewed and updated The nature of [place] [facility] with multiple MDs and other Advanced Practice Providers was explained to patient; also emphasized that fellows, residents, and students are part of  our team. Routine obstetric precautions reviewed Return in about 4 weeks (around 01/01/2022).   Donette Larry, CNM 4:00 PM 12/04/21

## 2021-12-04 NOTE — Progress Notes (Signed)
Pt has BP cuff at home

## 2021-12-05 LAB — CERVICOVAGINAL ANCILLARY ONLY
Bacterial Vaginitis (gardnerella): NEGATIVE
Candida Glabrata: NEGATIVE
Candida Vaginitis: POSITIVE — AB
Chlamydia: NEGATIVE
Comment: NEGATIVE
Comment: NEGATIVE
Comment: NEGATIVE
Comment: NEGATIVE
Comment: NORMAL
Neisseria Gonorrhea: NEGATIVE

## 2021-12-06 LAB — OBSTETRIC PANEL
Absolute Monocytes: 447 cells/uL (ref 200–950)
Antibody Screen: NOT DETECTED
Basophils Absolute: 42 cells/uL (ref 0–200)
Basophils Relative: 0.4 %
Eosinophils Absolute: 114 cells/uL (ref 15–500)
Eosinophils Relative: 1.1 %
HCT: 37.1 % (ref 35.0–45.0)
Hemoglobin: 12.1 g/dL (ref 11.7–15.5)
Hepatitis B Surface Ag: NONREACTIVE
Lymphs Abs: 1529 cells/uL (ref 850–3900)
MCH: 29.9 pg (ref 27.0–33.0)
MCHC: 32.6 g/dL (ref 32.0–36.0)
MCV: 91.6 fL (ref 80.0–100.0)
MPV: 12.2 fL (ref 7.5–12.5)
Monocytes Relative: 4.3 %
Neutro Abs: 8268 cells/uL — ABNORMAL HIGH (ref 1500–7800)
Neutrophils Relative %: 79.5 %
Platelets: 197 10*3/uL (ref 140–400)
RBC: 4.05 10*6/uL (ref 3.80–5.10)
RDW: 13.4 % (ref 11.0–15.0)
RPR Ser Ql: NONREACTIVE
Rubella: 3.97 Index
Total Lymphocyte: 14.7 %
WBC: 10.4 10*3/uL (ref 3.8–10.8)

## 2021-12-06 LAB — URINE CULTURE, OB REFLEX

## 2021-12-06 LAB — CULTURE, OB URINE

## 2021-12-06 LAB — HIV ANTIBODY (ROUTINE TESTING W REFLEX): HIV 1&2 Ab, 4th Generation: NONREACTIVE

## 2021-12-06 LAB — HEPATITIS C ANTIBODY: Hepatitis C Ab: NONREACTIVE

## 2021-12-09 ENCOUNTER — Encounter: Payer: Self-pay | Admitting: Certified Nurse Midwife

## 2021-12-10 ENCOUNTER — Other Ambulatory Visit: Payer: Self-pay

## 2021-12-10 DIAGNOSIS — B379 Candidiasis, unspecified: Secondary | ICD-10-CM

## 2021-12-10 MED ORDER — TERCONAZOLE 0.4 % VA CREA: 1.0000 | TOPICAL_CREAM | Freq: Every day | VAGINAL | 0 refills | Status: DC

## 2021-12-10 NOTE — Progress Notes (Signed)
Terazol 7 sent for pt due to positive yeast

## 2022-01-04 ENCOUNTER — Other Ambulatory Visit: Payer: Self-pay | Admitting: *Deleted

## 2022-01-04 ENCOUNTER — Ambulatory Visit: Payer: Medicaid Other | Admitting: *Deleted

## 2022-01-04 ENCOUNTER — Ambulatory Visit: Payer: Medicaid Other | Attending: Certified Nurse Midwife

## 2022-01-04 VITALS — BP 116/54 | HR 84

## 2022-01-04 DIAGNOSIS — O0930 Supervision of pregnancy with insufficient antenatal care, unspecified trimester: Secondary | ICD-10-CM

## 2022-01-04 DIAGNOSIS — O09299 Supervision of pregnancy with other poor reproductive or obstetric history, unspecified trimester: Secondary | ICD-10-CM | POA: Diagnosis present

## 2022-01-04 DIAGNOSIS — O09292 Supervision of pregnancy with other poor reproductive or obstetric history, second trimester: Secondary | ICD-10-CM | POA: Insufficient documentation

## 2022-01-04 DIAGNOSIS — O0932 Supervision of pregnancy with insufficient antenatal care, second trimester: Secondary | ICD-10-CM | POA: Diagnosis not present

## 2022-01-04 DIAGNOSIS — O3662X0 Maternal care for excessive fetal growth, second trimester, not applicable or unspecified: Secondary | ICD-10-CM | POA: Insufficient documentation

## 2022-01-04 DIAGNOSIS — O1492 Unspecified pre-eclampsia, second trimester: Secondary | ICD-10-CM | POA: Diagnosis not present

## 2022-01-04 DIAGNOSIS — O35EXX0 Maternal care for other (suspected) fetal abnormality and damage, fetal genitourinary anomalies, not applicable or unspecified: Secondary | ICD-10-CM | POA: Diagnosis not present

## 2022-01-04 DIAGNOSIS — Z3A24 24 weeks gestation of pregnancy: Secondary | ICD-10-CM | POA: Diagnosis not present

## 2022-01-04 DIAGNOSIS — Z363 Encounter for antenatal screening for malformations: Secondary | ICD-10-CM | POA: Insufficient documentation

## 2022-01-04 DIAGNOSIS — Z3480 Encounter for supervision of other normal pregnancy, unspecified trimester: Secondary | ICD-10-CM

## 2022-01-04 DIAGNOSIS — Z362 Encounter for other antenatal screening follow-up: Secondary | ICD-10-CM

## 2022-01-14 NOTE — L&D Delivery Note (Signed)
Delivery Note At 1047 a viable female infant was delivered via SVD, waterbirth, presentation: LOA. APGAR: 7, 8; weight pending.   Placenta status: after 30 minutes placenta was undelivered but palpated in the vagina therefore it was grasped, twisted and with extra attention to the trailing membranes it was delivered intact. Fundus firm with massage and Pitocin.   Anesthesia: none Lacerations: 1st degree hemostatic Est. Blood Loss (mL): 50 Placenta to LD Complications none Cord ph n/a   Mom to postpartum. Baby to Couplet care / Skin to Skin.    Donette Larry, CNM 04/26/2022 11:38 AM

## 2022-01-29 ENCOUNTER — Ambulatory Visit (INDEPENDENT_AMBULATORY_CARE_PROVIDER_SITE_OTHER): Payer: Medicaid Other | Admitting: Obstetrics and Gynecology

## 2022-01-29 VITALS — BP 128/72 | HR 86 | Wt 182.0 lb

## 2022-01-29 DIAGNOSIS — Z3480 Encounter for supervision of other normal pregnancy, unspecified trimester: Secondary | ICD-10-CM

## 2022-01-29 DIAGNOSIS — Z23 Encounter for immunization: Secondary | ICD-10-CM

## 2022-01-29 DIAGNOSIS — Z3A28 28 weeks gestation of pregnancy: Secondary | ICD-10-CM

## 2022-01-29 DIAGNOSIS — Z3483 Encounter for supervision of other normal pregnancy, third trimester: Secondary | ICD-10-CM

## 2022-01-29 MED ORDER — SERTRALINE HCL 50 MG PO TABS: 50.0000 mg | ORAL_TABLET | Freq: Every day | ORAL | 2 refills | Status: AC

## 2022-01-29 MED ORDER — HYDROXYZINE HCL 10 MG PO TABS: 10.0000 mg | ORAL_TABLET | Freq: Three times a day (TID) | ORAL | 2 refills | Status: AC | PRN

## 2022-01-29 NOTE — Progress Notes (Signed)
   PRENATAL VISIT NOTE  Subjective:  Ariel Andrews is a 32 y.o. G3P2002 at [redacted]w[redacted]d being seen today for ongoing prenatal care.  She is currently monitored for the following issues for this low-risk pregnancy and has Psychogenic vaginismus; Supervision of other normal pregnancy, antepartum; History of pre-eclampsia in prior pregnancy, currently pregnant in second trimester; History of macrosomia in infant in prior pregnancy, currently pregnant; and Late prenatal care on their problem list.  Patient reports no complaints.  Contractions: Not present. Vag. Bleeding: None.  Movement: Present. Denies leaking of fluid.   The following portions of the patient's history were reviewed and updated as appropriate: allergies, current medications, past family history, past medical history, past social history, past surgical history and problem list.   Objective:   Vitals:   01/29/22 1558  BP: 128/72  Pulse: 86  Weight: 182 lb (82.6 kg)    Fetal Status: Fetal Heart Rate (bpm): 140 Fundal Height: 29 cm Movement: Present     General:  Alert, oriented and cooperative. Patient is in no acute distress.  Skin: Skin is warm and dry. No rash noted.   Cardiovascular: Normal heart rate noted  Respiratory: Normal respiratory effort, no problems with respiration noted  Abdomen: Soft, gravid, appropriate for gestational age.  Pain/Pressure: Absent     Pelvic: Cervical exam deferred        Extremities: Normal range of motion.     Mental Status: Normal mood and affect. Normal behavior. Normal judgment and thought content.   Assessment and Plan:  Pregnancy: G3P2002 at [redacted]w[redacted]d  1. Supervision of other normal pregnancy, antepartum  - Limited prenatal care. Needs 2 hour GTT and 3rd trimester labs with A1c. She has agreeed to come back this week or next week.  - Tdap vaccine greater than or equal to 7yo IM  - Discussed requirement of WB class and showed her how to register. Consent will be reviewed and signed with a  CNM during admission for labor. Labor is unpredictable.  - discussed importance of keeping OB visits - Refill request for zoloft and vistril sent.  - Declined genetic screening.    Preterm labor symptoms and general obstetric precautions including but not limited to vaginal bleeding, contractions, leaking of fluid and fetal movement were reviewed in detail with the patient. Please refer to After Visit Summary for other counseling recommendations.   Return This week or next week for 2 hour GTT, she should be fasting..  Future Appointments  Date Time Provider [place]  02/04/2022  1:30 PM [facility] NURSE [facility]  02/04/2022  1:45 PM [facility] [place], NP

## 2022-01-29 NOTE — Patient Instructions (Signed)
[facility] healthy baby

## 2022-01-29 NOTE — Progress Notes (Signed)
Needs RF on Zoloft and Atarax

## 2022-02-01 ENCOUNTER — Telehealth: Payer: Self-pay | Admitting: *Deleted

## 2022-02-01 NOTE — Telephone Encounter (Signed)
Voicemail full.

## 2022-02-04 ENCOUNTER — Ambulatory Visit: Payer: Medicaid Other | Admitting: *Deleted

## 2022-02-04 ENCOUNTER — Ambulatory Visit: Payer: Medicaid Other | Attending: Maternal & Fetal Medicine

## 2022-02-04 ENCOUNTER — Other Ambulatory Visit: Payer: Self-pay | Admitting: *Deleted

## 2022-02-04 ENCOUNTER — Encounter: Payer: Self-pay | Admitting: *Deleted

## 2022-02-04 VITALS — BP 123/63 | HR 75

## 2022-02-04 DIAGNOSIS — O1493 Unspecified pre-eclampsia, third trimester: Secondary | ICD-10-CM | POA: Diagnosis not present

## 2022-02-04 DIAGNOSIS — O35EXX0 Maternal care for other (suspected) fetal abnormality and damage, fetal genitourinary anomalies, not applicable or unspecified: Secondary | ICD-10-CM | POA: Diagnosis not present

## 2022-02-04 DIAGNOSIS — Z3480 Encounter for supervision of other normal pregnancy, unspecified trimester: Secondary | ICD-10-CM | POA: Insufficient documentation

## 2022-02-04 DIAGNOSIS — O99213 Obesity complicating pregnancy, third trimester: Secondary | ICD-10-CM

## 2022-02-04 DIAGNOSIS — O0930 Supervision of pregnancy with insufficient antenatal care, unspecified trimester: Secondary | ICD-10-CM

## 2022-02-04 DIAGNOSIS — Z3A29 29 weeks gestation of pregnancy: Secondary | ICD-10-CM | POA: Diagnosis not present

## 2022-02-04 DIAGNOSIS — O0933 Supervision of pregnancy with insufficient antenatal care, third trimester: Secondary | ICD-10-CM

## 2022-02-04 DIAGNOSIS — O3663X0 Maternal care for excessive fetal growth, third trimester, not applicable or unspecified: Secondary | ICD-10-CM | POA: Diagnosis not present

## 2022-02-04 DIAGNOSIS — O09299 Supervision of pregnancy with other poor reproductive or obstetric history, unspecified trimester: Secondary | ICD-10-CM | POA: Diagnosis present

## 2022-02-04 DIAGNOSIS — O09293 Supervision of pregnancy with other poor reproductive or obstetric history, third trimester: Secondary | ICD-10-CM | POA: Diagnosis not present

## 2022-02-04 DIAGNOSIS — O09292 Supervision of pregnancy with other poor reproductive or obstetric history, second trimester: Secondary | ICD-10-CM | POA: Insufficient documentation

## 2022-02-04 DIAGNOSIS — Z362 Encounter for other antenatal screening follow-up: Secondary | ICD-10-CM | POA: Insufficient documentation

## 2022-02-13 ENCOUNTER — Encounter: Payer: Medicaid Other | Admitting: Certified Nurse Midwife

## 2022-02-21 ENCOUNTER — Encounter: Payer: Medicaid Other | Admitting: Obstetrics and Gynecology

## 2022-02-26 ENCOUNTER — Telehealth: Payer: Self-pay | Admitting: *Deleted

## 2022-02-26 NOTE — Telephone Encounter (Signed)
Voicemail full.

## 2022-03-25 ENCOUNTER — Ambulatory Visit: Payer: Medicaid Other | Attending: Obstetrics

## 2022-03-25 ENCOUNTER — Ambulatory Visit: Payer: Medicaid Other

## 2022-03-25 ENCOUNTER — Telehealth: Payer: Self-pay | Admitting: *Deleted

## 2022-03-25 NOTE — Telephone Encounter (Signed)
-----   Message from Tarry Kos sent at 03/25/2022  9:25 AM EDT ----- Please reach out to patient.  She has not had PNV since January.

## 2022-03-25 NOTE — Telephone Encounter (Signed)
Left patient an urgent message to call and reschedule no show appointment from 02/21/2022. Patient read MyChart message from 02/26/2022 but never called to reschedule.

## 2022-04-08 ENCOUNTER — Encounter: Payer: Self-pay | Admitting: *Deleted

## 2022-04-08 ENCOUNTER — Ambulatory Visit: Payer: Medicaid Other | Attending: Obstetrics

## 2022-04-08 ENCOUNTER — Ambulatory Visit: Payer: Medicaid Other | Admitting: *Deleted

## 2022-04-08 VITALS — BP 118/62 | HR 78

## 2022-04-08 DIAGNOSIS — O0930 Supervision of pregnancy with insufficient antenatal care, unspecified trimester: Secondary | ICD-10-CM | POA: Insufficient documentation

## 2022-04-08 DIAGNOSIS — O1493 Unspecified pre-eclampsia, third trimester: Secondary | ICD-10-CM | POA: Diagnosis not present

## 2022-04-08 DIAGNOSIS — O09293 Supervision of pregnancy with other poor reproductive or obstetric history, third trimester: Secondary | ICD-10-CM | POA: Diagnosis not present

## 2022-04-08 DIAGNOSIS — Z3480 Encounter for supervision of other normal pregnancy, unspecified trimester: Secondary | ICD-10-CM

## 2022-04-08 DIAGNOSIS — O09299 Supervision of pregnancy with other poor reproductive or obstetric history, unspecified trimester: Secondary | ICD-10-CM | POA: Insufficient documentation

## 2022-04-08 DIAGNOSIS — O99213 Obesity complicating pregnancy, third trimester: Secondary | ICD-10-CM | POA: Insufficient documentation

## 2022-04-08 DIAGNOSIS — O35EXX0 Maternal care for other (suspected) fetal abnormality and damage, fetal genitourinary anomalies, not applicable or unspecified: Secondary | ICD-10-CM | POA: Diagnosis not present

## 2022-04-08 DIAGNOSIS — Z3A38 38 weeks gestation of pregnancy: Secondary | ICD-10-CM | POA: Insufficient documentation

## 2022-04-08 DIAGNOSIS — O3663X0 Maternal care for excessive fetal growth, third trimester, not applicable or unspecified: Secondary | ICD-10-CM | POA: Insufficient documentation

## 2022-04-08 DIAGNOSIS — O0933 Supervision of pregnancy with insufficient antenatal care, third trimester: Secondary | ICD-10-CM | POA: Diagnosis not present

## 2022-04-08 DIAGNOSIS — O09292 Supervision of pregnancy with other poor reproductive or obstetric history, second trimester: Secondary | ICD-10-CM | POA: Insufficient documentation

## 2022-04-11 ENCOUNTER — Ambulatory Visit (INDEPENDENT_AMBULATORY_CARE_PROVIDER_SITE_OTHER): Payer: Medicaid Other | Admitting: Obstetrics and Gynecology

## 2022-04-11 ENCOUNTER — Other Ambulatory Visit (HOSPITAL_COMMUNITY)
Admission: RE | Admit: 2022-04-11 | Discharge: 2022-04-11 | Disposition: A | Discharge status: Home / Self Care | Payer: Medicaid Other | Source: Ambulatory Visit | Attending: Obstetrics and Gynecology | Admitting: Obstetrics and Gynecology

## 2022-04-11 VITALS — BP 112/63 | HR 80 | Wt 190.0 lb

## 2022-04-11 DIAGNOSIS — Z3480 Encounter for supervision of other normal pregnancy, unspecified trimester: Secondary | ICD-10-CM | POA: Diagnosis present

## 2022-04-11 DIAGNOSIS — O09293 Supervision of pregnancy with other poor reproductive or obstetric history, third trimester: Secondary | ICD-10-CM

## 2022-04-11 DIAGNOSIS — Z8759 Personal history of other complications of pregnancy, childbirth and the puerperium: Secondary | ICD-10-CM

## 2022-04-11 DIAGNOSIS — Z3A38 38 weeks gestation of pregnancy: Secondary | ICD-10-CM

## 2022-04-11 DIAGNOSIS — O0933 Supervision of pregnancy with insufficient antenatal care, third trimester: Secondary | ICD-10-CM

## 2022-04-11 DIAGNOSIS — O09299 Supervision of pregnancy with other poor reproductive or obstetric history, unspecified trimester: Secondary | ICD-10-CM

## 2022-04-11 MED ORDER — LANCET DEVICE MISC: 1.0000 | Freq: Three times a day (TID) | 0 refills | Status: DC

## 2022-04-11 MED ORDER — LANCETS MISC. MISC: 1.0000 | Freq: Three times a day (TID) | 0 refills | Status: DC

## 2022-04-11 MED ORDER — BLOOD GLUCOSE MONITOR KIT: PACK | 0 refills | Status: DC

## 2022-04-11 MED ORDER — BLOOD GLUCOSE MONITORING SUPPL DEVI: 1.0000 | Freq: Three times a day (TID) | 0 refills | Status: DC

## 2022-04-11 MED ORDER — BLOOD GLUCOSE TEST VI STRP: 1.0000 | ORAL_STRIP | Freq: Three times a day (TID) | 0 refills | Status: DC

## 2022-04-11 NOTE — Progress Notes (Signed)
   PRENATAL VISIT NOTE  Subjective:  Ariel Andrews is a 32 y.o. G3P2002 at [redacted]w[redacted]d being seen today for ongoing prenatal care.  She is currently monitored for the following issues for this low-risk pregnancy and has Psychogenic vaginismus; Supervision of other normal pregnancy, antepartum; History of pre-eclampsia in prior pregnancy, currently pregnant in second trimester; History of macrosomia in infant in prior pregnancy, currently pregnant; and Late prenatal care on their problem list.  Patient reports  doing well overall .  Contractions: Not present. Vag. Bleeding: None.  Movement: Present. Denies leaking of fluid.   The following portions of the patient's history were reviewed and updated as appropriate: allergies, current medications, past family history, past medical history, past social history, past surgical history and problem list.   Objective:   Vitals:   04/11/22 1547  BP: 112/63  Pulse: 80  Weight: 190 lb (86.2 kg)   Fetal Status: Fetal Heart Rate (bpm): 134   Movement: Present     General:  Alert, oriented and cooperative. Patient is in no acute distress.  Skin: Skin is warm and dry. No rash noted.   Cardiovascular: Normal heart rate noted  Respiratory: Normal respiratory effort, no problems with respiration noted  Abdomen: Soft, gravid, appropriate for gestational age.  Pain/Pressure: Absent      Assessment and Plan:  Pregnancy: G3P2002 at [redacted]w[redacted]d 1. Supervision of other normal pregnancy, antepartum 2. [redacted] weeks gestation of pregnancy - Normal growth (see below) - Pt has not completed glucose testing this pregnancy. We discussed importance of glucose testing, but she does not want to drink the glucola due to the high sugar load and feeling like it is not natural. We discussed alternative of jelly beans or home glucose monitoring. She is willing to do a week of home BG monitoring, and we were able to provide her with a glucometer, test strips & lancets today. Instructed  patient to keep a log of fasting BG as well as 1 or 2 hour postprandials.  - Interested in water birth - she has taken the class. Reviewed that she needs a CNM appt to review consent and determine if patient is eligible - Culture, beta strep (group b only) - Cervicovaginal ancillary only( [place]) - HIV antibody (with reflex) - RPR - CBC - HgB A1c  3. History of pre-eclampsia in prior pregnancy Normotensive today  4. History of macrosomia in infant in prior pregnancy, currently pregnant Growth [place] 3/25: @38 /2: 3429g (63%), AC [id number], [id number], cephalic, posterior  5. Limited prenatal care in third trimester  Term labor symptoms and general obstetric precautions including but not limited to vaginal bleeding, contractions, leaking of fluid and fetal movement were reviewed in detail with the patient.  Return in about 1 week (around 04/18/2022) for return OB at 39 weeks with CNM.  Future Appointments  Date Time Provider [place]  04/16/2022  3:50 PM Renee Harder, CNM [facility]    Inez Catalina, MD

## 2022-04-11 NOTE — Patient Instructions (Addendum)
Fasting numbers - first thing in the morning before you eat or drink anything 1-2 hours after every meal - just make a note of if it's 1 hour or 2 hours

## 2022-04-12 ENCOUNTER — Encounter: Payer: Self-pay | Admitting: Obstetrics and Gynecology

## 2022-04-12 LAB — CERVICOVAGINAL ANCILLARY ONLY
Chlamydia: NEGATIVE
Comment: NEGATIVE
Comment: NORMAL
Neisseria Gonorrhea: NEGATIVE

## 2022-04-12 LAB — CBC
Hematocrit: 38.2 % (ref 34.0–46.6)
Hemoglobin: 12.7 g/dL (ref 11.1–15.9)
MCH: 28.9 pg (ref 26.6–33.0)
MCHC: 33.2 g/dL (ref 31.5–35.7)
MCV: 87 fL (ref 79–97)
Platelets: 125 10*3/uL — ABNORMAL LOW (ref 150–450)
RBC: 4.4 x10E6/uL (ref 3.77–5.28)
RDW: 16.7 % — ABNORMAL HIGH (ref 11.7–15.4)
WBC: 10.1 10*3/uL (ref 3.4–10.8)

## 2022-04-12 LAB — RPR: RPR Ser Ql: NONREACTIVE

## 2022-04-12 LAB — HEMOGLOBIN A1C
Est. average glucose Bld gHb Est-mCnc: 114 mg/dL
Hgb A1c MFr Bld: 5.6 % (ref 4.8–5.6)

## 2022-04-12 LAB — HIV ANTIBODY (ROUTINE TESTING W REFLEX): HIV Screen 4th Generation wRfx: NONREACTIVE

## 2022-04-15 LAB — CULTURE, BETA STREP (GROUP B ONLY): Strep Gp B Culture: NEGATIVE

## 2022-04-16 ENCOUNTER — Ambulatory Visit (INDEPENDENT_AMBULATORY_CARE_PROVIDER_SITE_OTHER): Payer: Medicaid Other

## 2022-04-16 VITALS — BP 117/65 | HR 86 | Wt 195.0 lb

## 2022-04-16 DIAGNOSIS — Z3480 Encounter for supervision of other normal pregnancy, unspecified trimester: Secondary | ICD-10-CM

## 2022-04-16 DIAGNOSIS — Z3483 Encounter for supervision of other normal pregnancy, third trimester: Secondary | ICD-10-CM

## 2022-04-16 DIAGNOSIS — Z3A39 39 weeks gestation of pregnancy: Secondary | ICD-10-CM

## 2022-04-16 NOTE — Patient Instructions (Signed)
These supplements and herbs are available over the counter without a prescription. They are often in the vitamin section of a pharmacy.   For pregnancy Blood Builder Magnesium - get Calm drink or gummies __________________________________________________________________________________________  To help ripen your Cervix/prepare the uterus (to get your cervix ready for labor):   Red Raspberry Leaf capsules:  two 300mg or 400mg tablets with each meal, 2-3 times a day  Potential Side Effects Of Raspberry Leaf:  Most women do not experience any side effects from drinking raspberry leaf tea. However, nausea and loose stools are possible. This can cause uterine irritability. If you notice having many braxton hicks contractions, stop this supplement.    Evening Primrose Oil capsules: may take 1 to 3 capsules daily. May also prick one to release the oil and insert it into your vagina at night.  One regimen would be to take 1 tablets three times per day and place 2 tablets in the vagina at night.   Some of the potential side effects:  Upset stomach  Loose stools or diarrhea  Headaches  Nausea   _____________________________________________________________________________________________  For Labor: All of these can be use in the last trimester of pregnancy  5-6 Dates a day -- This can help shorten your labor (may taste better if warmed in microwave until soft). This can also be combined with almond butter, any other nut butter, or wrap in bacon and bake, or toss in smoothies. Can also eat Lara bars. Found where raisins are in the grocery store   ______________________________________________________________________________________________  For Breastfeeding:   NOTHING WILL WORK UNLESS YOU ARE  - Emptying the breast adequately/completely - Emptying the breast regularly  Supplement/Herb Purpose Dose Side Effects  Vitamin D Increase Vitamin D to infant, recommended for all breastfeeding  moms and can use instead of supplementing infant 4000 IU daily None  Fenugreek** use with extreme caution as this can decrease milk supply in some especially those with thyroid disorders  Increases prolactin  400mg TID (max) Nausea, Loose stools and smelling like maple syrup  Goat's Rue Help increase differentiation of breast tissue, good for women with suspected IGT. Helps with insulin sensitvity 1 capsule BID Nausea, loose stools  Legendairy Milk Supplements -PumpPrincess -Liquid Gold -Cash Cow -Milkapalooza  Combination of herbal galactogues  Per packaging Per packaging  Lactation cookies/bars Food based galactogues/increase maternal calories All the time, q2-3 hours    Flax seeds Food based Galactogue Daily GI upset, nausea  Brewer's yeast Food based Galactogue Daily GI upset, nausea  Hemp Hearts Food based Galactogue Daily GI upset, nausea  Oats Food based Galactogue Daily   Lecithin (Soy or Sunflower) Decreases the viscosity of milk, galactogue, fat emulsifier  Good for oversupply mothers to prevent clogged ducts 1200mg three times per day GI upset  Rehydration drinks (Gatorade/Powerade) Improves maternal hydration and mammary glands are histologically similar to sweat glands  None, caution use in patients with T2DM     

## 2022-04-16 NOTE — Progress Notes (Signed)
   PRENATAL VISIT NOTE  Subjective:  Ariel Andrews is a 32 y.o. G3P2002 at [redacted]w[redacted]d being seen today for ongoing prenatal care.  She is currently monitored for the following issues for this low-risk pregnancy and has Psychogenic vaginismus; Gestational thrombocytopenia; Supervision of other normal pregnancy, antepartum; History of pre-eclampsia in prior pregnancy, currently pregnant in second trimester; History of macrosomia in infant in prior pregnancy, currently pregnant; and Late prenatal care on their problem list.  Patient reports no complaints.  Contractions: Irritability. Vag. Bleeding: None.  Movement: Present. Denies leaking of fluid.   The following portions of the patient's history were reviewed and updated as appropriate: allergies, current medications, past family history, past medical history, past social history, past surgical history and problem list.   Objective:   Vitals:   04/16/22 1550  BP: 117/65  Pulse: 86  Weight: 195 lb (88.5 kg)   Fetal Status: Fetal Heart Rate (bpm): 134 Fundal Height: 38 cm Movement: Present  Presentation: Vertex  General:  Alert, oriented and cooperative. Patient is in no acute distress.  Skin: Skin is warm and dry. No rash noted.   Cardiovascular: Normal heart rate noted  Respiratory: Normal respiratory effort, no problems with respiration noted  Abdomen: Soft, gravid, appropriate for gestational age.  Pain/Pressure: Absent     Pelvic: Cervical exam performed in the presence of a chaperone Dilation: 2.5 Effacement (%): 50 Station: -3  Extremities: Normal range of motion.  Edema: Trace  Mental Status: Normal mood and affect. Normal behavior. Normal judgment and thought content.   Assessment and Plan:  Pregnancy: G3P2002 at [redacted]w[redacted]d 1. Supervision of other normal pregnancy, antepartum - Routine OB. Doing well, no concerns. Occasional braxton hicks contractions - Interested in Microbiologist, questions answered. Pt to enroll in class, see midwives  for most visits. Discussed waterbirth as option for low risk pregnancy. Pregnancy is hard to predict and things may arise that prevent immersion in water but labor can be supported with freedom of movement, birthing ball, shower and birth can be supported in position of pt choosing, etc, even if waterbirth becomes unavailable. Pt states understanding. Patient completed class, instructed to upload certificate into [place]. Reviewed that CNM on call at hospital would be the one to consent patient for water birth, but no risk factors at this time. F/u with CNM in 1 week to further discuss eligibility for waterbirth. - Patient has completed 1 week of fingersticks as she did not have GTT. Reviewed and sporadic elevated fasting, otherwise all within range. Instructed patient to track CBGs for 1 more week. A1C normal.  - Cervical exam per patient request  2. [redacted] weeks gestation of pregnancy - FH appropriate - Endorses active fetal movement   Term labor symptoms and general obstetric precautions including but not limited to vaginal bleeding, contractions, leaking of fluid and fetal movement were reviewed in detail with the patient. Please refer to After Visit Summary for other counseling recommendations.   Return in about 1 week (around 04/23/2022) for [place] with CNM.  Future Appointments  Date Time Provider [place]  04/25/2022  4:10 PM Janyth Pupa, [place], CNM

## 2022-04-25 ENCOUNTER — Ambulatory Visit (INDEPENDENT_AMBULATORY_CARE_PROVIDER_SITE_OTHER): Payer: Medicaid Other | Admitting: Obstetrics & Gynecology

## 2022-04-25 ENCOUNTER — Ambulatory Visit (INDEPENDENT_AMBULATORY_CARE_PROVIDER_SITE_OTHER): Payer: Medicaid Other | Admitting: *Deleted

## 2022-04-25 VITALS — BP 132/73 | HR 94 | Wt 191.0 lb

## 2022-04-25 DIAGNOSIS — Z3A40 40 weeks gestation of pregnancy: Secondary | ICD-10-CM

## 2022-04-25 DIAGNOSIS — O0930 Supervision of pregnancy with insufficient antenatal care, unspecified trimester: Secondary | ICD-10-CM

## 2022-04-25 DIAGNOSIS — O09299 Supervision of pregnancy with other poor reproductive or obstetric history, unspecified trimester: Secondary | ICD-10-CM

## 2022-04-25 DIAGNOSIS — Z3403 Encounter for supervision of normal first pregnancy, third trimester: Secondary | ICD-10-CM

## 2022-04-25 DIAGNOSIS — Z3480 Encounter for supervision of other normal pregnancy, unspecified trimester: Secondary | ICD-10-CM

## 2022-04-25 DIAGNOSIS — O09292 Supervision of pregnancy with other poor reproductive or obstetric history, second trimester: Secondary | ICD-10-CM

## 2022-04-25 DIAGNOSIS — O480 Post-term pregnancy: Secondary | ICD-10-CM

## 2022-04-25 NOTE — Progress Notes (Signed)
NST today-Reactive 

## 2022-04-25 NOTE — Progress Notes (Signed)
   LOW-RISK PREGNANCY VISIT Patient name: Ariel Andrews MRN [id number]  Date of birth: 07-15-1990 Chief Complaint:   Routine Prenatal Visit  History of Present Illness:   Ariel Andrews is a 32 y.o. G13P2002 female at [redacted]w[redacted]d with an Estimated Date of Delivery: 04/20/22 being seen today for ongoing management of a low-risk pregnancy.   -desires waterbirth     12/04/2021    2:09 PM  Depression screen PHQ 2/9  Decreased Interest 0  Down, Depressed, Hopeless 0  PHQ - 2 Score 0  Altered sleeping 0  Tired, decreased energy 1  Change in appetite 0  Feeling bad or failure about yourself  0  Trouble concentrating 0  Moving slowly or fidgety/restless 0  Suicidal thoughts 0  PHQ-9 Score 1    Today she reports occasional contractions. Contractions: Irritability. Vag. Bleeding: None.  Movement: Present. denies leaking of fluid. Review of Systems:   Pertinent items are noted in HPI Denies abnormal vaginal discharge w/ itching/odor/irritation, headaches, visual changes, shortness of breath, chest pain, abdominal pain, severe nausea/vomiting, or problems with urination or bowel movements unless otherwise stated above. Pertinent History Reviewed:  Reviewed past medical,surgical, social, obstetrical and family history.  Reviewed problem list, medications and allergies.  Physical Assessment:   Vitals:   04/25/22 1545  BP: 132/73  Pulse: 94  Weight: 191 lb (86.6 kg)  Body mass index is 34.93 kg/m.        Physical Examination:   General appearance: Well appearing, and in no distress  Mental status: Alert, oriented to person, place, and time  Skin: Warm & dry  Respiratory: Normal respiratory effort, no distress  Abdomen: Soft, gravid, nontender  Pelvic: Cervical exam performed  Dilation: 2 Effacement (%): 50 Station: -3  Extremities: Edema: Trace  Psych:  mood and affect appropriate  Fetal Status:     Movement: Present  Reactive NST  Chaperone:  pt declined     No results found for  this or any previous visit (from the past 24 hour(s)).   Assessment & Plan:  1) Low-risk pregnancy G3P2002 at [redacted]w[redacted]d with an Estimated Date of Delivery: 04/20/22   -Scheduled for induction of labor on Sunday -discussed the "why" as to IOL @ 41wks.  Reviewed increased risk of IUFD, pt agreeable   Meds: No orders of the defined types were placed in this encounter.  Labs/procedures today: NST  Plan:  Continue routine obstetrical care  Next visit: prefers in person    Reviewed: Term labor symptoms and general obstetric precautions including but not limited to vaginal bleeding, contractions, leaking of fluid and fetal movement were reviewed in detail with the patient.  All questions were answered.   Follow-up: Return in about 6 weeks (around 06/06/2022) for 6wk postpartum visit, plan for IOL.  No orders of the defined types were placed in this encounter.   Ariel Hidalgo, DO Attending Obstetrician & Gynecologist, [facility] for [employer], [facility]

## 2022-04-26 ENCOUNTER — Other Ambulatory Visit: Payer: Self-pay

## 2022-04-26 ENCOUNTER — Inpatient Hospital Stay (HOSPITAL_COMMUNITY)
Admission: AD | Admit: 2022-04-26 | Discharge: 2022-04-27 | DRG: 807 | Disposition: A | Discharge status: Home / Self Care | Payer: Medicaid Other | Attending: Obstetrics & Gynecology | Admitting: Obstetrics & Gynecology

## 2022-04-26 ENCOUNTER — Telehealth (HOSPITAL_COMMUNITY): Payer: Self-pay | Admitting: *Deleted

## 2022-04-26 ENCOUNTER — Encounter (HOSPITAL_COMMUNITY): Payer: Self-pay | Admitting: Obstetrics and Gynecology

## 2022-04-26 DIAGNOSIS — Z3A38 38 weeks gestation of pregnancy: Secondary | ICD-10-CM

## 2022-04-26 DIAGNOSIS — O480 Post-term pregnancy: Secondary | ICD-10-CM | POA: Diagnosis present

## 2022-04-26 DIAGNOSIS — O0930 Supervision of pregnancy with insufficient antenatal care, unspecified trimester: Secondary | ICD-10-CM

## 2022-04-26 DIAGNOSIS — O9912 Other diseases of the blood and blood-forming organs and certain disorders involving the immune mechanism complicating childbirth: Secondary | ICD-10-CM | POA: Diagnosis present

## 2022-04-26 DIAGNOSIS — O09292 Supervision of pregnancy with other poor reproductive or obstetric history, second trimester: Secondary | ICD-10-CM

## 2022-04-26 DIAGNOSIS — Z3480 Encounter for supervision of other normal pregnancy, unspecified trimester: Principal | ICD-10-CM

## 2022-04-26 DIAGNOSIS — D6959 Other secondary thrombocytopenia: Secondary | ICD-10-CM | POA: Diagnosis present

## 2022-04-26 DIAGNOSIS — Z3403 Encounter for supervision of normal first pregnancy, third trimester: Secondary | ICD-10-CM

## 2022-04-26 DIAGNOSIS — Z3A40 40 weeks gestation of pregnancy: Secondary | ICD-10-CM | POA: Diagnosis not present

## 2022-04-26 DIAGNOSIS — Z3490 Encounter for supervision of normal pregnancy, unspecified, unspecified trimester: Secondary | ICD-10-CM

## 2022-04-26 DIAGNOSIS — O09299 Supervision of pregnancy with other poor reproductive or obstetric history, unspecified trimester: Secondary | ICD-10-CM

## 2022-04-26 DIAGNOSIS — O4202 Full-term premature rupture of membranes, onset of labor within 24 hours of rupture: Secondary | ICD-10-CM | POA: Diagnosis not present

## 2022-04-26 LAB — CBC
HCT: 39 % (ref 36.0–46.0)
Hemoglobin: 13.4 g/dL (ref 12.0–15.0)
MCH: 30 pg (ref 26.0–34.0)
MCHC: 34.4 g/dL (ref 30.0–36.0)
MCV: 87.2 fL (ref 80.0–100.0)
Platelets: 137 10*3/uL — ABNORMAL LOW (ref 150–400)
RBC: 4.47 MIL/uL (ref 3.87–5.11)
RDW: 17 % — ABNORMAL HIGH (ref 11.5–15.5)
WBC: 14.9 10*3/uL — ABNORMAL HIGH (ref 4.0–10.5)
nRBC: 0 % (ref 0.0–0.2)

## 2022-04-26 LAB — TYPE AND SCREEN
ABO/RH(D): O POS
Antibody Screen: NEGATIVE

## 2022-04-26 LAB — RPR: RPR Ser Ql: NONREACTIVE

## 2022-04-26 MED ORDER — DIBUCAINE (PERIANAL) 1 % EX OINT: 1.0000 | TOPICAL_OINTMENT | CUTANEOUS | Status: DC | PRN

## 2022-04-26 MED ORDER — LACTATED RINGERS IV SOLN: 500.0000 mL | INTRAVENOUS | Status: DC | PRN

## 2022-04-26 MED ORDER — DIPHENHYDRAMINE HCL 25 MG PO CAPS: 25.0000 mg | ORAL_CAPSULE | Freq: Four times a day (QID) | ORAL | Status: DC | PRN

## 2022-04-26 MED ORDER — COCONUT OIL OIL: 1.0000 | TOPICAL_OIL | Status: DC | PRN

## 2022-04-26 MED ORDER — ACETAMINOPHEN 325 MG PO TABS: 650.0000 mg | ORAL_TABLET | ORAL | Status: DC | PRN

## 2022-04-26 MED ORDER — IBUPROFEN 600 MG PO TABS
600.0000 mg | ORAL_TABLET | Freq: Four times a day (QID) | ORAL | Status: DC
  Administered 2022-04-26 – 2022-04-27 (×4): 600 mg via ORAL
  Filled 2022-04-26 (×4): qty 1

## 2022-04-26 MED ORDER — OXYTOCIN-SODIUM CHLORIDE 30-0.9 UT/500ML-% IV SOLN
2.5000 [IU]/h | INTRAVENOUS | Status: DC
  Administered 2022-04-26: 2.5 [IU]/h via INTRAVENOUS
  Filled 2022-04-26: qty 500

## 2022-04-26 MED ORDER — PRENATAL MULTIVITAMIN CH
1.0000 | ORAL_TABLET | Freq: Every day | ORAL | Status: DC
  Filled 2022-04-26: qty 1

## 2022-04-26 MED ORDER — MEASLES, MUMPS & RUBELLA VAC IJ SOLR: 0.5000 mL | Freq: Once | INTRAMUSCULAR | Status: DC

## 2022-04-26 MED ORDER — OXYCODONE-ACETAMINOPHEN 5-325 MG PO TABS: 2.0000 | ORAL_TABLET | ORAL | Status: DC | PRN

## 2022-04-26 MED ORDER — BENZOCAINE-MENTHOL 20-0.5 % EX AERO: 1.0000 | INHALATION_SPRAY | CUTANEOUS | Status: DC | PRN

## 2022-04-26 MED ORDER — WITCH HAZEL-GLYCERIN EX PADS: 1.0000 | MEDICATED_PAD | CUTANEOUS | Status: DC | PRN

## 2022-04-26 MED ORDER — FENTANYL CITRATE (PF) 100 MCG/2ML IJ SOLN
INTRAMUSCULAR | Status: AC
  Filled 2022-04-26: qty 2

## 2022-04-26 MED ORDER — SENNOSIDES-DOCUSATE SODIUM 8.6-50 MG PO TABS
2.0000 | ORAL_TABLET | ORAL | Status: DC
  Administered 2022-04-27: 2 via ORAL
  Filled 2022-04-26: qty 2

## 2022-04-26 MED ORDER — OXYCODONE-ACETAMINOPHEN 5-325 MG PO TABS: 1.0000 | ORAL_TABLET | ORAL | Status: DC | PRN

## 2022-04-26 MED ORDER — SOD CITRATE-CITRIC ACID 500-334 MG/5ML PO SOLN: 30.0000 mL | ORAL | Status: DC | PRN

## 2022-04-26 MED ORDER — ONDANSETRON HCL 4 MG/2ML IJ SOLN: 4.0000 mg | INTRAMUSCULAR | Status: DC | PRN

## 2022-04-26 MED ORDER — TETANUS-DIPHTH-ACELL PERTUSSIS 5-2.5-18.5 LF-MCG/0.5 IM SUSY: 0.5000 mL | PREFILLED_SYRINGE | Freq: Once | INTRAMUSCULAR | Status: DC

## 2022-04-26 MED ORDER — FENTANYL CITRATE (PF) 100 MCG/2ML IJ SOLN
50.0000 ug | INTRAMUSCULAR | Status: DC | PRN
  Administered 2022-04-26 (×2): 50 ug via INTRAVENOUS

## 2022-04-26 MED ORDER — ONDANSETRON HCL 4 MG PO TABS: 4.0000 mg | ORAL_TABLET | ORAL | Status: DC | PRN

## 2022-04-26 MED ORDER — SIMETHICONE 80 MG PO CHEW: 80.0000 mg | CHEWABLE_TABLET | ORAL | Status: DC | PRN

## 2022-04-26 MED ORDER — LACTATED RINGERS IV SOLN: INTRAVENOUS | Status: DC

## 2022-04-26 MED ORDER — ONDANSETRON HCL 4 MG/2ML IJ SOLN
INTRAMUSCULAR | Status: AC
  Filled 2022-04-26: qty 2

## 2022-04-26 MED ORDER — LIDOCAINE HCL (PF) 1 % IJ SOLN: 30.0000 mL | INTRAMUSCULAR | Status: DC | PRN

## 2022-04-26 MED ORDER — OXYTOCIN BOLUS FROM INFUSION
333.0000 mL | Freq: Once | INTRAVENOUS | Status: AC
  Administered 2022-04-26: 333 mL via INTRAVENOUS

## 2022-04-26 MED ORDER — ONDANSETRON HCL 4 MG/2ML IJ SOLN
4.0000 mg | Freq: Four times a day (QID) | INTRAMUSCULAR | Status: DC | PRN
  Administered 2022-04-26: 4 mg via INTRAVENOUS

## 2022-04-26 NOTE — Social Work (Signed)
MOB was referred for history of depression/anxiety.  * Referral screened out by Clinical Social Worker because none of the following criteria appear to apply:  ~ History of anxiety/depression during this pregnancy, or of post-partum depression following prior delivery.   ~ Diagnosis of anxiety and/or depression within last 3 years OR * MOB's symptoms currently being treated with medication and/or therapy. Per chart review MOB has active prescription and is taking Zoloft. Per OB records no concerns noted during pregnancy.  Please contact the Clinical Social Worker if needs arise, or by MOB request.   Abe Schools, LCSWA Clinical Social Worker [phone] 

## 2022-04-26 NOTE — Lactation Note (Signed)
This note was copied from a baby's chart. Lactation Consultation Note  Patient Name: Ariel Andrews DGLOV'F Date: 04/26/2022 Age:31 hours Reason for consult: Initial assessment;Term;Breastfeeding assistance  LC entered the room and the infant was in the bassinet.  LC did not observe a latch.  Per the birth parent the infant latched and fed for 20 min on each side at the last feeding.  She stated that the latch was comfortable.  The birth parent is an experienced breastfeeding parent.  LC reviewed outpatient services and asked the birth parent to call for lactation at the next feeding.   Infant Feeding Plan:  Breastfeed 8+ times in 24 hours according to feeding cues.  Hand express and feed the expressed milk to the infant via a bottle.  Call RN/LC for assistance with breastfeeding.   Maternal Data Has patient been taught Hand Expression?: Yes Does the patient have breastfeeding experience prior to this delivery?: Yes How long did the patient breastfeed?: 3 months with both of her older children  Feeding Mother's Current Feeding Choice: Breast Milk and Formula  LATCH Score Latch: Grasps breast easily, tongue down, lips flanged, rhythmical sucking.  Audible Swallowing: A few with stimulation  Type of Nipple: Everted at rest and after stimulation  Comfort (Breast/Nipple): Soft / non-tender  Hold (Positioning): No assistance needed to correctly position infant at breast.  LATCH Score: 9  Interventions Interventions: Education;LC Services brochure  Discharge Pump: DEBP;Personal  Consult Status Consult Status: Follow-up Date: 04/27/22 Follow-up type: In-patient   Ariel Andrews 04/26/2022, 3:12 PM

## 2022-04-26 NOTE — Discharge Summary (Signed)
Postpartum Discharge Summary  Date of Service updated***     Patient Name: Ariel Andrews DOB: 1990-12-14 MRN: [id number]  Date of admission: 04/26/2022 Delivery date:04/26/2022  Delivering provider: Donette Larry  Date of discharge: 04/26/2022  Admitting diagnosis: Intrauterine normal pregnancy [Z34.90] Intrauterine pregnancy: [redacted]w[redacted]d     Secondary diagnosis:  Principal Problem:   Intrauterine normal pregnancy Active Problems:   SVD (spontaneous vaginal delivery)  Additional problems: hx PEC    Discharge diagnosis: Term Pregnancy Delivered                                              Post partum procedures:{Postpartum procedures:23558} Augmentation: N/A Complications: None  Hospital course: Onset of Labor With Vaginal Delivery      32 y.o. yo [id number] at [redacted]w[redacted]d was admitted in Active Labor on 04/26/2022. Labor course was uncomplicated. Membrane Rupture Time/Date: 6:35 AM ,04/26/2022   Delivery Method:Vaginal, Spontaneous  Episiotomy: None  Lacerations:  1st degree  Patient had a postpartum course complicated by ***.  She is ambulating, tolerating a regular diet, passing flatus, and urinating well. Patient is discharged home in stable condition on 04/26/22.  Newborn Data: Birth date:04/26/2022  Birth time:10:47 AM  Gender:Female  Living status:Living  Apgars:7 ,8  Weight:   Magnesium Sulfate received: No BMZ received: No Rhophylac:N/A MMR:{MMR:30440033} T-DaP:{Tdap:23962} Flu: {JYN:82956} Transfusion:{Transfusion received:30440034}  Physical exam  Vitals:   04/26/22 1120 04/26/22 1133 04/26/22 1146 04/26/22 1149  BP: 132/63 122/71 112/72 112/72  Pulse: 84 86 87 87  Resp:      Temp:      TempSrc:      Weight:      Height:       General: {Exam; general:21111117} Lochia: {Desc; appropriate/inappropriate:30686::"appropriate"} Uterine Fundus: {Desc; firm/soft:30687} Incision: {Exam; incision:21111123} DVT Evaluation: {Exam; dvt:2111122} Labs: Lab Results   Component Value Date   WBC 14.9 (H) 04/26/2022   HGB 13.4 04/26/2022   HCT 39.0 04/26/2022   MCV 87.2 04/26/2022   PLT 137 (L) 04/26/2022      Latest Ref Rng & Units 01/09/2018    8:05 PM  CMP  Glucose 70 - 99 mg/dL 84   BUN 6 - 20 mg/dL 17   Creatinine 2.13 - 1.00 mg/dL 0.86   Sodium 578 - 469 mmol/L 136   Potassium 3.5 - 5.1 mmol/L 4.7   Chloride 98 - 111 mmol/L 107   CO2 22 - 32 mmol/L 17   Calcium 8.9 - 10.3 mg/dL 9.3   Total Protein 6.5 - 8.1 g/dL 7.4   Total Bilirubin 0.3 - 1.2 mg/dL 1.8   Alkaline Phos 38 - 126 U/L 181   AST 15 - 41 U/L 50   ALT 0 - 44 U/L 38    Edinburgh Score:    02/10/2018    2:13 PM  Edinburgh Postnatal Depression Scale Screening Tool  I have been able to laugh and see the funny side of things. 0  I have looked forward with enjoyment to things. 0  I have blamed myself unnecessarily when things went wrong. 0  I have been anxious or worried for no good reason. 0  I have felt scared or panicky for no good reason. 0  Things have been getting on top of me. 1  I have been so unhappy that I have had difficulty sleeping. 0  I have felt sad or miserable.  0  I have been so unhappy that I have been crying. 1  The thought of harming myself has occurred to me. 0  Edinburgh Postnatal Depression Scale Total 2     After visit meds:  Allergies as of 04/26/2022   No Known Allergies   Med Rec must be completed prior to using this [facility]***        Discharge home in stable condition Infant Feeding: {Baby feeding:23562} Infant Disposition:{CHL IP OB HOME WITH WUXLKG:40102} Discharge instruction: per After Visit Summary and Postpartum booklet. Activity: Advance as tolerated. Pelvic rest for 6 weeks.  Diet: {OB VOZD:66440347} Future Appointments: Future Appointments  Date Time Provider Department Center  06/11/2022  3:10 PM Brand Males, CNM [facility]   Follow up Visit:   Please schedule this patient for a In person  postpartum visit in 6 weeks with the following provider: Any provider. Additional Postpartum F/U: none   Low risk pregnancy complicated by:  none Delivery mode:  Vaginal, Spontaneous  Anticipated Birth Control:  Condoms   04/26/2022 Donette Larry, CNM

## 2022-04-26 NOTE — Telephone Encounter (Signed)
Preadmission screen  

## 2022-04-26 NOTE — MAU Note (Addendum)
...  Ariel Andrews is a 32 y.o. at [redacted]w[redacted]d here in MAU reporting: Water broke at [phone]. CTX began one hour ago and are currently 3 minutes apart. Endorses bloody show. Had her membranes swept yesterday. Grossly ruptured.+FM.   Cervical exam yesterday:  250-3.  GBS-. Late to Prenatal Care at [redacted]w[redacted]d Declined GTT - see Routine Prenatal note on 3/28. Patient completed two weeks of BG monitoring.   Onset of complaint: 0635 Pain score: 5/10 lower abdomen   FHT: 160 initial external Lab orders placed from triage:  MAU Labor Eval

## 2022-04-26 NOTE — H&P (Addendum)
OBSTETRIC ADMISSION HISTORY AND PHYSICAL  Ariel Andrews is a 32 y.o. female G3P2002 with IUP at [redacted]w[redacted]d by LMP presenting for SROM @0635 . She reports +FMs, no VB, no blurry vision, headaches or peripheral edema, and RUQ pain.  She plans on breast and bottle feeding. She request condoms for birth control. She received her prenatal [facility] at [facility]   Dating: By LMP --->  Estimated Date of Delivery: 04/20/22  Sono:    @[redacted]w[redacted]d , CWD, normal anatomy, cephalic presentation, 3429g, 77% EFW   Prenatal History/Complications:  -Late prenatal care [redacted]w[redacted]d -Hx preeclampsia in prior pregnancy -Hx macrosomia in prior pregnancy -Gestational thrombocytopenia (125 @38w ) -Did not complete GTT but had normal home CBG monitoring  Past Medical History: Past Medical History:  Diagnosis Date   Anxiety    Medical history non-contributory     Past Surgical History: Past Surgical History:  Procedure Laterality Date   CYST EXCISION      Obstetrical History: OB History     Gravida  3   Para  2   Term  2   Preterm      AB      Living  2      SAB      IAB      Ectopic      Multiple  0   Live Births  2           Social History Social History   Socioeconomic History   Marital status: Married    Spouse name: Jomarie Longs   Number of children: Not on file   Years of education: Not on file   Highest education level: Not on file  Occupational History   Not on file  Tobacco Use   Smoking status: Never   Smokeless tobacco: Never  Vaping Use   Vaping Use: Never used  Substance and Sexual Activity   Alcohol use: No    Alcohol/week: 0.0 standard drinks of alcohol   Drug use: No   Sexual activity: Yes    Birth control/protection: Condom  Other Topics Concern   Not on file  Social History Narrative   Not on file   Social Determinants of Health   Financial Resource Strain: Not on file  Food Insecurity: Not on file  Transportation Needs: Not on file  Physical Activity: Not on file   Stress: Not on file  Social Connections: Not on file    Family History: Family History  Problem Relation Age of Onset   Hypertension Mother    Colon polyps Mother    Cancer Maternal Grandfather    Colon cancer Maternal Grandfather    Heart disease Maternal Grandfather    Asthma Neg Hx    Diabetes Neg Hx     Allergies: No Known Allergies  Medications Prior to Admission  Medication Sig Dispense Refill Last Dose   Prenatal Vit-Fe Fumarate-FA (PRENATAL VITAMIN PO) Take by mouth.   04/25/2022 at 2100   sertraline (ZOLOFT) 50 MG tablet Take 1 tablet (50 mg total) by mouth daily. 90 tablet 2 04/25/2022 at 2100   blood glucose meter kit and supplies KIT Dispense based on patient and insurance preference. Use up to four times daily as directed. (Patient not taking: Reported on 04/16/2022) 1 each 0    Blood Glucose Monitoring Suppl DEVI 1 each by Does not apply route in the morning, at noon, and at bedtime. May substitute to any manufacturer covered by patient's insurance. (Patient not taking: Reported on 04/16/2022) 1 each 0  Glucose Blood (BLOOD GLUCOSE TEST STRIPS) STRP 1 each by In Vitro route in the morning, at noon, and at bedtime. May substitute to any manufacturer covered by patient's insurance. (Patient not taking: Reported on 04/16/2022) 50 each 0    hydrOXYzine (ATARAX) 10 MG tablet Take 1 tablet (10 mg total) by mouth 3 (three) times daily as needed. 90 tablet 2 More than a month   Lancet Device MISC 1 each by Does not apply route in the morning, at noon, and at bedtime. May substitute to any manufacturer covered by patient's insurance. (Patient not taking: Reported on 04/16/2022) 50 each 0    Lancets Misc. MISC 1 each by Does not apply route in the morning, at noon, and at bedtime. May substitute to any manufacturer covered by patient's insurance. (Patient not taking: Reported on 04/16/2022) 100 each 0      Review of Systems   All systems reviewed and negative except as stated in  HPI  Blood pressure 130/75, pulse 99, temperature 97.7 F (36.5 C), temperature source Oral, resp. rate 17, height  (1.6 m), weight 88 kg, last menstrual period 07/14/2021. General appearance: alert and no distress Lungs: clear to auscultation bilaterally Heart: regular rate and rhythm Abdomen: soft, non-tender; bowel sounds normal Extremities: Homans sign is negative, no sign of DVT Fetal monitoringBaseline: 135 bpm, Variability: Good {> 6 bpm), Accelerations: Reactive, and Decelerations: Absent Uterine activityFrequency: Every 3-4 minutes and Intensity: moderate Dilation: 6.5 Effacement (%): 70 Station: -2  Prenatal labs: ABO, Rh: --/--/O POS (04/12 6962) Antibody: NEG (04/12 9528) Rubella: 3.97 (11/21 1416) RPR: Non Reactive (03/28 1629)  HBsAg: NON-REACTIVE (11/21 1416)  HIV: Non Reactive (03/28 1629)  GBS: Negative/-- (03/28 1543)  1 hr Glucola not performed, normal home CBGs Genetic screening  declined  Prenatal Transfer Tool  Maternal Diabetes: No Genetic Screening: Declined Maternal Ultrasounds/Referrals: Normal Fetal Ultrasounds or other Referrals:  None Maternal Substance Abuse:  No Significant Maternal Medications:  None Significant Maternal Lab Results:  None Number of Prenatal Visits:greater than 3 verified prenatal visits Other Comments:  None  Results for orders placed or performed during the hospital encounter of 04/26/22 (from the past 24 hour(s))  CBC   Collection Time: 04/26/22  8:32 AM  Result Value Ref Range   WBC 14.9 (H) 4.0 - 10.5 K/uL   RBC 4.47 3.87 - 5.11 MIL/uL   Hemoglobin 13.4 12.0 - 15.0 g/dL   HCT 41.3 24.4 - 01.0 %   MCV 87.2 80.0 - 100.0 fL   MCH 30.0 26.0 - 34.0 pg   MCHC 34.4 30.0 - 36.0 g/dL   RDW 27.2 (H) 53.6 - 64.4 %   Platelets 137 (L) 150 - 400 K/uL   nRBC 0.0 0.0 - 0.2 %  Type and screen   Collection Time: 04/26/22  8:32 AM  Result Value Ref Range   ABO/RH(D) O POS    Antibody Screen NEG    Sample Expiration       04/29/2022,2359 Performed at [facility], [address], [place], [place] [address]     Patient Active Problem List   Diagnosis Date Noted   Intrauterine normal pregnancy 04/26/2022   History of pre-eclampsia in prior pregnancy, currently pregnant in second trimester 12/04/2021   History of macrosomia in infant in prior pregnancy, currently pregnant 12/04/2021   Late prenatal care 12/04/2021   Supervision of other normal pregnancy, antepartum 12/03/2021   Gestational thrombocytopenia 05/06/2015   Psychogenic vaginismus 02/09/2015    Assessment/Plan:  Ariel Andrews is a 32 y.o. G3P2002 at [redacted]w[redacted]d here for SROM now in active labor  #Labor: Desires waterbirth. Active labor, 6.5/70/-2. Expectant management #Pain: Per patient #FWB: Cat I #ID:  GBS neg #MOF: breast and bottle #MOC: condoms #Circ:  N/A  Vonna Drafts, MD  04/26/2022, 10:02 AM  Midwife attestation: I have seen and examined this patient; I agree with above documentation in the resident's note.   ROS, labs, PMH reviewed  PE: Gen: calm comfortable, NAD Resp: normal effort and rate Abd: gravid  Assessment/Plan: [redacted] weeks gestation Labor: active FWB: Cat I GBS: neg Admit to LD Planning waterbirth- consent signed, attended class (certificate verified) Anticipate SVD  Donette Larry, CNM  04/26/2022, 10:16 AM

## 2022-04-27 MED ORDER — IBUPROFEN 600 MG PO TABS: 600.0000 mg | ORAL_TABLET | Freq: Four times a day (QID) | ORAL | 3 refills | Status: AC | PRN

## 2022-04-27 MED ORDER — SENNOSIDES-DOCUSATE SODIUM 8.6-50 MG PO TABS: 2.0000 | ORAL_TABLET | Freq: Every evening | ORAL | 2 refills | Status: AC | PRN

## 2022-04-27 MED ORDER — ACETAMINOPHEN 325 MG PO TABS: 1000.0000 mg | ORAL_TABLET | Freq: Four times a day (QID) | ORAL | 0 refills | Status: AC | PRN

## 2022-04-27 NOTE — Progress Notes (Signed)
Post Partum Day 1 s/p waterbirth Subjective: No complaints, up ad lib, voiding, tolerating PO, and + flatus. OOB. Baby is stable at bedside. Moderate lochia reported.  Objective: Blood pressure 115/67, pulse 60, temperature 98 F (36.7 C), temperature source Oral, resp. rate 18, height 5\' 3"  (1.6 m), weight 88 kg, last menstrual period 07/14/2021, SpO2 99 %, unknown if currently breastfeeding.  Physical Exam:  General: alert and no distress Lochia: appropriate Uterine Fundus: firm, NT DVT Evaluation: No evidence of DVT seen on physical exam. Negative Homan's sign. No cords or calf tenderness. No significant calf/ankle edema.  Recent Labs    04/26/22 0832  HGB 13.4  HCT 39.0    Assessment/Plan: Breastfeeding and Contraception condoms May decide to go home later today, undecided for now Routine postpartum care.   LOS: 1 day   Jaynie Collins, MD 04/27/2022, 9:45 AM

## 2022-04-28 ENCOUNTER — Inpatient Hospital Stay (HOSPITAL_COMMUNITY)
Admission: RE | Admit: 2022-04-28 | Discharge: 2022-04-28 | Disposition: A | Discharge status: Home / Self Care | Payer: Medicaid Other | Source: Ambulatory Visit | Attending: Obstetrics & Gynecology | Admitting: Obstetrics & Gynecology

## 2022-05-06 ENCOUNTER — Telehealth (HOSPITAL_COMMUNITY): Payer: Self-pay | Admitting: *Deleted

## 2022-05-06 NOTE — Telephone Encounter (Signed)
Attempted hospital discharge follow-up call. Left message for patient to return RN call with any questions or concerns. Deforest Hoyles, RN, 05/06/22, [phone]

## 2022-06-11 ENCOUNTER — Ambulatory Visit (INDEPENDENT_AMBULATORY_CARE_PROVIDER_SITE_OTHER): Payer: Medicaid Other

## 2022-06-11 VITALS — BP 110/74 | HR 62 | Ht 63.0 in | Wt 166.0 lb

## 2022-06-11 DIAGNOSIS — R32 Unspecified urinary incontinence: Secondary | ICD-10-CM

## 2022-06-11 DIAGNOSIS — K5900 Constipation, unspecified: Secondary | ICD-10-CM | POA: Diagnosis not present

## 2022-06-11 NOTE — Progress Notes (Signed)
Post Partum Visit Note  Ariel Andrews is a 32 y.o. [id number] female who presents for a postpartum visit. She is 6 weeks postpartum following a normal spontaneous vaginal delivery.  I have fully reviewed the prenatal and intrapartum course. The delivery was at 40.6 gestational weeks.  Anesthesia: none. Postpartum course has been unremarkable. Baby is doing well. Baby is feeding by both breast and bottle - Similac Advance. Bleeding no bleeding. Bowel function is abnormal:   . Bladder function is  having some urinary incontinence . Patient is not sexually active. Contraception method is none. Postpartum depression screening: negative.   The pregnancy intention screening data noted above was reviewed. Potential methods of contraception were discussed. The patient elected to proceed with No data recorded.   Edinburgh Postnatal Depression Scale - 06/11/22 1530       Edinburgh Postnatal Depression Scale:  In the Past 7 Days   I have been able to laugh and see the funny side of things. 0    I have looked forward with enjoyment to things. 0    I have blamed myself unnecessarily when things went wrong. 0    I have been anxious or worried for no good reason. 2    I have felt scared or panicky for no good reason. 0    Things have been getting on top of me. 0    I have been so unhappy that I have had difficulty sleeping. 0    I have felt sad or miserable. 0    I have been so unhappy that I have been crying. 0    The thought of harming myself has occurred to me. 0    Edinburgh Postnatal Depression Scale Total 2             Health Maintenance Due  Topic Date Due   COVID-19 Vaccine (1) Never done   PAP SMEAR-Modifier  07/28/2022    The following portions of the patient's history were reviewed and updated as appropriate: allergies, current medications, past family history, past medical history, past social history, past surgical history, and problem list.  Review of Systems Pertinent items  are noted in HPI.  Objective:  BP 110/74   Pulse 62   Ht 5\' 3"  (1.6 m)   Wt 166 lb (75.3 kg)   BMI 29.41 kg/m    General:  alert, cooperative, and no distress   Breasts:  not indicated  Lungs: Normal effort  Heart:   Regular rate  Abdomen: soft, non-tender; bowel sounds normal; no masses,  no organomegaly   Wound N/a  GU exam:  not indicated       Assessment:   1. Urinary incontinence, unspecified type  - Ambulatory referral to Physical Therapy  2. Postpartum care and examination - Normal postpartum exam  3. Constipation, unspecified constipation type - Safe meds reviewed - Increase fiber and water intake - Miralax cleanout reviewed   Plan:   Essential components of care per ACOG recommendations:  1.  Mood and well being: Patient with negative depression screening today. Reviewed local resources for support.  - Patient tobacco use? No.   - hx of drug use? No.    2. Infant care and feeding:  -Patient currently breastmilk feeding? Yes. Discussed returning to work and pumping. Reviewed importance of draining breast regularly to support lactation.  -Social determinants of health (SDOH) reviewed in EPIC. No concerns  3. Sexuality, contraception and birth spacing - Patient does not want a pregnancy in  the next year.  Desired family size is 4 children.  - Reviewed reproductive life planning. Reviewed contraceptive methods based on pt preferences and effectiveness.  Patient desired Female Condom today.   - Discussed birth spacing of 18 months  4. Sleep and fatigue -Encouraged family/partner/community support of 4 hrs of uninterrupted sleep to help with mood and fatigue  5. Physical Recovery  - Discussed patients delivery and complications. She describes her labor as good. - Patient had a Vaginal, no problems at delivery. Patient had a 1st degree laceration. Perineal healing reviewed. Patient expressed understanding - Patient has urinary incontinence? Yes. Discussed role  of pelvic floor PT. Offered PT and patient accepted. Patient was referred to pelvic floor PT.  - Patient is safe to resume physical and sexual activity  6.  Health Maintenance - HM due items addressed Yes - Last pap smear  Diagnosis  Date Value Ref Range Status  07/28/2019   Final   - Negative for intraepithelial lesion or malignancy (NILM)   Pap smear not done at today's visit.  -Breast Cancer screening indicated? No.   7. Chronic Disease/Pregnancy Condition follow up: None   Brand Males, CNM [facility] [employer], [facility]

## 2022-06-11 NOTE — Patient Instructions (Signed)
Safe Medications in Pregnancy  ? ? ?Acne: ?Benzoyl Peroxide ?Salicylic Acid ? ?Backache/Headache: ?Tylenol: 2 regular strength every 4 hours OR ?             2 Extra strength every 6 hours ? ?Colds/Coughs/Allergies: ?Benadryl (alcohol free) 25 mg every 6 hours as needed ?Breath right strips ?Claritin ?Cepacol throat lozenges ?Chloraseptic throat spray ?Cold-Eeze- up to three times per day ?Cough drops, alcohol free ?Flonase (by prescription only) ?Guaifenesin ?Mucinex ?Robitussin DM (plain only, alcohol free) ?Saline nasal spray/drops ?Sudafed (pseudoephedrine) & Actifed ** use only after [redacted] weeks gestation and if you do not have high blood pressure ?Tylenol ?Vicks Vaporub ?Zinc lozenges ?Zyrtec  ? ?Constipation: ?Colace ?Ducolax suppositories ?Fleet enema ?Glycerin suppositories ?Metamucil ?Milk of magnesia ?Miralax ?Senokot ?Smooth move tea ? ?Diarrhea: ?Kaopectate ?Imodium A-D ? ?*NO pepto Bismol ? ?Hemorrhoids: ?Anusol ?Anusol HC ?Preparation H ?Tucks ? ?Indigestion: ?Tums ?Maalox ?Mylanta ?Zantac  ?Pepcid ? ?Insomnia: ?Benadryl (alcohol free) 25mg every 6 hours as needed ?Tylenol PM ?Unisom, no Gelcaps ? ?Leg Cramps: ?Tums ?MagGel ? ?Nausea/Vomiting:  ?Bonine ?Dramamine ?Emetrol ?Ginger extract ?Sea bands ?Meclizine  ?Nausea medication to take during pregnancy:  ?Unisom (doxylamine succinate 25 mg tablets) Take one tablet daily at bedtime. If symptoms are not adequately controlled, the dose can be increased to a maximum recommended dose of two tablets daily (1/2 tablet in the morning, 1/2 tablet mid-afternoon and one at bedtime). ?Vitamin B6 100mg tablets. Take one tablet twice a day (up to 200 mg per day). ? ?Skin Rashes: ?Aveeno products ?Benadryl cream or 25mg every 6 hours as needed ?Calamine Lotion ?1% cortisone cream ? ?Yeast infection: ?Gyne-lotrimin 7 ?Monistat 7 ? ? ?**If taking multiple medications, please check labels to avoid duplicating the same active ingredients ?**take  medication as directed on the label ?** Do not exceed 4000 mg of tylenol in 24 hours ?**Do not take medications that contain aspirin or ibuprofen ? ? ? ?You have constipation which is hard stools that are difficult to pass. It is important to have regular bowel movements every 1-3 days that are soft and easy to pass. Hard stools increase your risk of hemorrhoids and are very uncomfortable.  ? ?To prevent constipation you can increase the amount of fiber in your diet. Examples of foods with fiber are leafy greens, whole grain breads, oatmeal and other grains.  It is also important to drink at least eight 8oz glass of water everyday.  ? ?If you have not has a bowel movement in 4-5 days you made need to clean out your bowel.  This will have establish normal movement through your bowel.   ? ?Miralax Clean out ?Take 8 capfuls of miralax in 64 oz of gatorade. You can use any fluid that appeals to you (gatorade, water, juice) ?Continue to drink at least eight 8 oz glasses of water throughout the day ?You can repeat with another 8 capfuls of miralax in 64 oz of gatorade if you are not having a large amount of stools ?You will need to be at home and close to a bathroom for about 8 hours when you do the above as you may need to go to the bathroom frequently.  ? ?After you are cleaned out: ?- Start Colace100mg twice daily ?- Start Miralax once daily ?- Start a daily fiber supplement like metamucil or citrucel ?- You can safely use enemas in pregnancy  ?- if you are having diarrhea you can reduce to Colace once a day or miralax every other   day or a 1/2 capful daily.  ? ?

## 2023-06-04 ENCOUNTER — Telehealth: Payer: Self-pay | Admitting: *Deleted

## 2023-06-04 NOTE — Telephone Encounter (Signed)
 Left patient a message to call to schedule annual.
